# Patient Record
Sex: Male | Born: 1985 | Race: Black or African American | Hispanic: No | Marital: Single | Smoking: Current every day smoker
Health system: Southern US, Community
[De-identification: ages and names within clinical notes are randomized; demographics above are authoritative.]

## PROBLEM LIST (undated history)

## (undated) DIAGNOSIS — I82409 Acute embolism and thrombosis of unspecified deep veins of unspecified lower extremity: Secondary | ICD-10-CM

## (undated) DIAGNOSIS — F250 Schizoaffective disorder, bipolar type: Secondary | ICD-10-CM

## (undated) DIAGNOSIS — K5090 Crohn's disease, unspecified, without complications: Secondary | ICD-10-CM

---

## 2016-04-08 ENCOUNTER — Encounter (HOSPITAL_BASED_OUTPATIENT_CLINIC_OR_DEPARTMENT_OTHER): Payer: Self-pay

## 2016-04-08 ENCOUNTER — Inpatient Hospital Stay (HOSPITAL_BASED_OUTPATIENT_CLINIC_OR_DEPARTMENT_OTHER)
Admission: EM | Admit: 2016-04-08 | Discharge: 2016-04-14 | DRG: 271 | Disposition: A | Discharge status: Home / Self Care | Payer: Medicaid Other | Attending: Internal Medicine | Admitting: Internal Medicine

## 2016-04-08 ENCOUNTER — Emergency Department (HOSPITAL_BASED_OUTPATIENT_CLINIC_OR_DEPARTMENT_OTHER): Payer: Medicaid Other

## 2016-04-08 DIAGNOSIS — K5090 Crohn's disease, unspecified, without complications: Secondary | ICD-10-CM

## 2016-04-08 DIAGNOSIS — F259 Schizoaffective disorder, unspecified: Secondary | ICD-10-CM

## 2016-04-08 DIAGNOSIS — Z79891 Long term (current) use of opiate analgesic: Secondary | ICD-10-CM

## 2016-04-08 DIAGNOSIS — R5082 Postprocedural fever: Secondary | ICD-10-CM | POA: Diagnosis not present

## 2016-04-08 DIAGNOSIS — Z6824 Body mass index (BMI) 24.0-24.9, adult: Secondary | ICD-10-CM

## 2016-04-08 DIAGNOSIS — I82422 Acute embolism and thrombosis of left iliac vein: Secondary | ICD-10-CM | POA: Diagnosis present

## 2016-04-08 DIAGNOSIS — I82402 Acute embolism and thrombosis of unspecified deep veins of left lower extremity: Secondary | ICD-10-CM

## 2016-04-08 DIAGNOSIS — I82412 Acute embolism and thrombosis of left femoral vein: Principal | ICD-10-CM | POA: Diagnosis present

## 2016-04-08 DIAGNOSIS — I82442 Acute embolism and thrombosis of left tibial vein: Secondary | ICD-10-CM | POA: Diagnosis present

## 2016-04-08 DIAGNOSIS — I82401 Acute embolism and thrombosis of unspecified deep veins of right lower extremity: Secondary | ICD-10-CM

## 2016-04-08 DIAGNOSIS — D509 Iron deficiency anemia, unspecified: Secondary | ICD-10-CM

## 2016-04-08 DIAGNOSIS — Z7952 Long term (current) use of systemic steroids: Secondary | ICD-10-CM

## 2016-04-08 DIAGNOSIS — E876 Hypokalemia: Secondary | ICD-10-CM | POA: Diagnosis not present

## 2016-04-08 DIAGNOSIS — F17210 Nicotine dependence, cigarettes, uncomplicated: Secondary | ICD-10-CM | POA: Diagnosis present

## 2016-04-08 DIAGNOSIS — I82492 Acute embolism and thrombosis of other specified deep vein of left lower extremity: Secondary | ICD-10-CM | POA: Diagnosis present

## 2016-04-08 DIAGNOSIS — F250 Schizoaffective disorder, bipolar type: Secondary | ICD-10-CM | POA: Diagnosis present

## 2016-04-08 DIAGNOSIS — I82432 Acute embolism and thrombosis of left popliteal vein: Secondary | ICD-10-CM | POA: Diagnosis present

## 2016-04-08 DIAGNOSIS — I82409 Acute embolism and thrombosis of unspecified deep veins of unspecified lower extremity: Secondary | ICD-10-CM | POA: Diagnosis present

## 2016-04-08 DIAGNOSIS — I82890 Acute embolism and thrombosis of other specified veins: Secondary | ICD-10-CM

## 2016-04-08 HISTORY — DX: Acute embolism and thrombosis of unspecified deep veins of unspecified lower extremity: I82.409

## 2016-04-08 HISTORY — DX: Crohn's disease, unspecified, without complications: K50.90

## 2016-04-08 HISTORY — DX: Schizoaffective disorder, bipolar type: F25.0

## 2016-04-08 LAB — CBC WITH DIFFERENTIAL/PLATELET
Basophils Absolute: 0 10*3/uL (ref 0.0–0.1)
Basophils Relative: 0 %
EOS ABS: 0 10*3/uL (ref 0.0–0.7)
Eosinophils Relative: 0 %
HCT: 36.1 % — ABNORMAL LOW (ref 39.0–52.0)
HEMOGLOBIN: 12.6 g/dL — AB (ref 13.0–17.0)
LYMPHS ABS: 0.5 10*3/uL — AB (ref 0.7–4.0)
Lymphocytes Relative: 6 %
MCH: 26.9 pg (ref 26.0–34.0)
MCHC: 34.9 g/dL (ref 30.0–36.0)
MCV: 77.1 fL — ABNORMAL LOW (ref 78.0–100.0)
Monocytes Absolute: 0.7 10*3/uL (ref 0.1–1.0)
Monocytes Relative: 7 %
NEUTROS PCT: 87 %
Neutro Abs: 8.3 10*3/uL — ABNORMAL HIGH (ref 1.7–7.7)
Platelets: 356 10*3/uL (ref 150–400)
RBC: 4.68 MIL/uL (ref 4.22–5.81)
RDW: 13.7 % (ref 11.5–15.5)
WBC: 9.6 10*3/uL (ref 4.0–10.5)

## 2016-04-08 LAB — BASIC METABOLIC PANEL
ANION GAP: 12 (ref 5–15)
BUN: 9 mg/dL (ref 6–20)
CHLORIDE: 89 mmol/L — AB (ref 101–111)
CO2: 31 mmol/L (ref 22–32)
CREATININE: 0.8 mg/dL (ref 0.61–1.24)
Calcium: 9.3 mg/dL (ref 8.9–10.3)
GFR calc non Af Amer: >60 mL/min (ref >60)
Glucose, Bld: 109 mg/dL — ABNORMAL HIGH (ref 65–99)
POTASSIUM: 4.1 mmol/L (ref 3.5–5.1)
SODIUM: 132 mmol/L — AB (ref 135–145)

## 2016-04-08 MED ORDER — HEPARIN (PORCINE) IN NACL 100-0.45 UNIT/ML-% IJ SOLN
2600.0000 [IU]/h | INTRAMUSCULAR | Status: DC
  Administered 2016-04-08: 1450 [IU]/h via INTRAVENOUS
  Administered 2016-04-10: 2200 [IU]/h via INTRAVENOUS
  Administered 2016-04-10: 2350 [IU]/h via INTRAVENOUS
  Administered 2016-04-10: 1950 [IU]/h via INTRAVENOUS
  Administered 2016-04-11 (×2): 2350 [IU]/h via INTRAVENOUS
  Administered 2016-04-12: 2600 [IU]/h via INTRAVENOUS
  Administered 2016-04-12: 2450 [IU]/h via INTRAVENOUS
  Administered 2016-04-13: 2600 [IU]/h via INTRAVENOUS
  Filled 2016-04-08 (×21): qty 250

## 2016-04-08 MED ORDER — SODIUM CHLORIDE 0.9 % IV BOLUS (SEPSIS)
2000.0000 mL | Freq: Once | INTRAVENOUS | Status: AC
  Administered 2016-04-08: 2000 mL via INTRAVENOUS

## 2016-04-08 MED ORDER — HEPARIN BOLUS VIA INFUSION
5000.0000 [IU] | Freq: Once | INTRAVENOUS | Status: AC
  Administered 2016-04-08: 5000 [IU] via INTRAVENOUS

## 2016-04-08 MED ORDER — HYDROMORPHONE HCL 1 MG/ML IJ SOLN
1.0000 mg | Freq: Once | INTRAMUSCULAR | Status: AC
  Administered 2016-04-08: 1 mg via INTRAVENOUS
  Filled 2016-04-08: qty 1

## 2016-04-08 MED ORDER — IOPAMIDOL (ISOVUE-370) INJECTION 76%
100.0000 mL | Freq: Once | INTRAVENOUS | Status: AC | PRN
  Administered 2016-04-08: 100 mL via INTRAVENOUS

## 2016-04-08 NOTE — Plan of Care (Addendum)
30 yo M with massive DVT of leg.  Dosent have phlegmasia thankfully, but IR says re-consult them in the morning, because this gets up in to pelvis, they almost always thrombo-lyse in these cases.  Heparin gtt for now.  Going to med-surg obs.

## 2016-04-08 NOTE — ED Notes (Signed)
C/o back pain x 2 weeks and left leg pain x 1 week,  Was seen by his pcp today and dx w dvt,  Was sent to ed by pcp

## 2016-04-08 NOTE — ED Notes (Signed)
[facility] is calling IR for Dr. Criss Alvine

## 2016-04-08 NOTE — ED Provider Notes (Signed)
CSN: [id number]     Arrival date & time 04/08/16  1830 History  By signing my name below, I, [facility], attest that this documentation has been prepared under the direction and in the presence of Pricilla Loveless, MD. Electronically Signed: Randell Patient, ED Scribe. 04/08/2016. 10:50 PM.   Chief Complaint  Patient presents with  . DVT   The history is provided by the patient and a parent. No language interpreter was used.   HPI Comments: Mark Orozco is a 30 y.o. male with an hx of Crohn's disease and DVT who presents to the Emergency Department complaining of constant, moderate, inner left leg pain onset 1 week ago. He notes that his pain is worse in his inner upper left leg but progressed to his lower left leg today. Pt states that pain began in his lower back, worse on the left, 2 weeks ago which progressed to left leg pain 1 week ago. He notes that he was seen earlier today for this complaint where he was diagnosed with a DVT in his left leg extending from his left knee to his groin by [place]. He reports intermittent SOB secondary to increased pain and abdominal pain that only presents when he moves. He takes generic Plavix. Denies taking Coumadin currently. 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 Denies CP, leg swelling, SOB currently, and any other symptoms.  Past Medical History  Diagnosis Date  . DVT (deep venous thrombosis) (HCC)   . Schizoaffective disorder, bipolar type (HCC)   . Crohn disease (HCC)    History reviewed. No pertinent past surgical history. No family history on file. Social History  Substance Use Topics  . Smoking status: Current Every Day Smoker  . Smokeless tobacco: None  . Alcohol Use: Yes     Comment: social    Review of Systems  Respiratory: Negative for shortness of breath.    Cardiovascular: Negative for chest pain and leg swelling.  Musculoskeletal: Positive for myalgias.  All other systems reviewed and are negative.  Allergies  Tylenol  Home Medications   Prior to Admission medications   Medication Sig Start Date End Date Taking? Authorizing Provider  BACLOFEN PO Take by mouth.   Yes Historical Provider, MD  DIAZEPAM PO Take by mouth.   Yes Historical Provider, MD  FAMOTIDINE PO Take by mouth.   Yes Historical Provider, MD  fluPHENAZine (PROLIXIN) 5 MG tablet Take 5 mg by mouth 2 (two) times daily.   Yes Historical Provider, MD  mesalamine (LIALDA) 1.2 g EC tablet Take by mouth daily with breakfast.   Yes Historical Provider, MD  Oxcarbazepine (TRILEPTAL) 300 MG tablet Take 300 mg by mouth 2 (two) times daily.   Yes Historical Provider, MD  OXYCODONE HCL PO Take by mouth.   Yes Historical Provider, MD  Paliperidone Palmitate (INVEGA SUSTENNA IM) Inject into the muscle.   Yes Historical Provider, MD  PREDNISONE PO Take by mouth.   Yes Historical Provider, MD   BP 131/85 mmHg  Pulse 85  Temp(Src) 97.8 F (36.6 C) (Oral)  Resp 12  Ht  (1.854 m)  Wt 187 lb (84.823 kg)  BMI 24.68 kg/m2  SpO2 100% Physical Exam  Constitutional: He is oriented to person, place, and time. He appears well-developed and well-nourished.  HENT:  Head: Normocephalic and  atraumatic.  Right Ear: External ear normal.  Left Ear: External ear normal.  Nose: Nose normal.  Eyes: Right eye exhibits no discharge. Left eye exhibits no discharge.  Neck: Neck supple.  Cardiovascular: Normal rate, regular rhythm, normal heart sounds and intact distal pulses.   2+ DP pulses bilaterally.  Pulmonary/Chest: Effort normal and breath sounds normal.  Abdominal: Soft. There is no tenderness.  Musculoskeletal: He exhibits tenderness. He exhibits no edema.       Legs: Left leg with calf tenderness and medial thigh tenderness. No swelling. No ecchymosis or other color change besides mild  erythema. Normal strength and sensation.  Neurological: He is alert and oriented to person, place, and time.  Skin: Skin is warm and dry.  Nursing note and vitals reviewed.   ED Course  Procedures   DIAGNOSTIC STUDIES: Oxygen Saturation is 100% on RA, normal by my interpretation.    COORDINATION OF CARE: 7:43 PM Will order IV fluids, Dilaudid, abdomen venogram CT scan, and labs. Discussed treatment plan with pt at bedside and pt agreed to plan.  8:49 PM Returned to speak with pt about treatment plan.  10:36 PM Reviewed lab and advanced imaging results. Will consult with vascular surgery and admit pt.  10:43 PM Consulted with on-call vascular surgeon who advised to consult with IR. Will consult with Interventional Radiology.  10:47 PM Consulted with on-call interventional radiologist. Will transfer to [facility].  Labs Review Labs Reviewed  BASIC METABOLIC PANEL - Abnormal; Notable for the following:    Sodium 132 (*)    Chloride 89 (*)    Glucose, Bld 109 (*)    All other components within normal limits  CBC WITH DIFFERENTIAL/PLATELET - Abnormal; Notable for the following:    Hemoglobin 12.6 (*)    HCT 36.1 (*)    MCV 77.1 (*)    Neutro Abs 8.3 (*)    Lymphs Abs 0.5 (*)    All other components within normal limits    Imaging Review Ct Venogram Abd/pel  04/08/2016  CLINICAL DATA:  Patient was diagnosed with deep venous thrombosis today in the left leg. Back pain. EXAM: CT venogram of  the abdomen, pelvis, and lower extremities TECHNIQUE: Axial CT images of the abdomen, pelvis, and bilateral lower extremities from hip to below the knee are obtained with contrast material during venous phase. Additional sagittal and coronal multiplanar reformatted images and maximal intensity projection images are obtained. CONTRAST:  100 mL Isovue 370 COMPARISON:  None. FINDINGS: CT venogram: There is dilatation and filling defect consistent with deep venous thrombosis involving the left  tibioperoneal trunk, left popliteal vein, left deep femoral vein, left common femoral vein, left external iliac vein, and left internal iliac vein. Left deep femoral vein is also involved. The inferior vena cava and iliac veins are somewhat flattened appear patent. Right pelvic and right leg veins are patent. Left leg veins demonstrate infiltration in the fat around the venous structures possibly due to obstruction or possibly indicating infection. Thrombophlebitis is not excluded. Mild diffuse soft tissue edema in the left leg. CT abdomen and pelvis: Lung bases are clear. The liver, spleen, gallbladder, pancreas, adrenal glands, kidneys, abdominal aorta, and retroperitoneal lymph nodes are unremarkable. Stomach, small bowel, and colon are not abnormally distended. No free air or free fluid in the abdomen. Pelvis: Bladder wall is not thickened. No pelvic mass or lymphadenopathy. No free or loculated pelvic fluid collections. Prostate gland is not enlarged. Edema in the soft tissues with superficial S collaterals.  Scattered lymph nodes in the pelvis are not abnormally distended. Appendix is normal. No destructive bone lesions. IMPRESSION: Deep venous thrombosis demonstrated involving the left pelvic veins and deep venous system of the left leg, extending from the left external iliac, internal iliac, common femoral, superficial and deep femoral, popliteal, and tibial peroneal veins. Associated edema and collateral venous structures demonstrated. Infiltration around the deep veins of the left leg may indicate edema or phlebitis. Electronically Signed   By: Burman Nieves M.D.   On: 04/08/2016 22:15   I have personally reviewed and evaluated these images and lab results as part of my medical decision-making.   EKG Interpretation None      MDM   Final diagnoses:  Acute deep vein thrombosis of pelvic vein  Leg DVT (deep venous thromboembolism), acute, left (HCC)    Patient's CT shows that the DVT goes  proximally all the way to the pelvic veins. Patient's pain is currently controlled and does not have severe, uncontrollable pain. Has very slight erythema over proximal thigh but no signs of phlegmasia cerulea dolens. Discussed with vascular surgery who recommends IR. Discussed with Dr. Karl Ito of IR who recommends admission to the hospitalist, IV heparin which was already started, and IR consult in the morning. Discussed with Dr. Julian Reil, who accepts the patient and admission and transfer. Request medical observation bed.  I personally performed the services described in this documentation, which was scribed in my presence. The recorded information has been reviewed and is accurate.    Pricilla Loveless, MD 04/08/16 2351

## 2016-04-08 NOTE — Progress Notes (Signed)
ANTICOAGULATION CONSULT NOTE - Initial Consult  Pharmacy Consult for Heparin Indication: DVT  Allergies  Allergen Reactions  . Tylenol [Acetaminophen]     Patient Measurements: Height:  (185.4 cm) Weight: 187 lb (84.823 kg) IBW/kg (Calculated) : 79.9 Heparin Dosing Weight: 84 kg  Vital Signs: Temp: 97.8 F (36.6 C) (05/15 1846) Temp Source: Oral (05/15 1846) BP: 136/91 mmHg (05/15 1846) Pulse Rate: 95 (05/15 1846)  Labs: No results for input(s): HGB, HCT, PLT, APTT, LABPROT, INR, HEPARINUNFRC, HEPRLOWMOCWT, CREATININE, CKTOTAL, CKMB, TROPONINI in the last 72 hours.  CrCl cannot be calculated (Patient has no serum creatinine result on file.).   Medical History: Past Medical History  Diagnosis Date  . DVT (deep venous thrombosis) (HCC)   . Schizoaffective disorder, bipolar type (HCC)   . Crohn disease (HCC)     Medications:   (Not in a hospital admission) Scheduled:  .  HYDROmorphone (DILAUDID) injection  1 mg Intravenous Once   Infusions:  . sodium chloride      Assessment: 29yo male presents to [facility] from PCP with diagnosis of DVT. Pharmacy is consulted to dose heparin for DVT.   Goal of Therapy:  Heparin level 0.3-0.7 units/ml Monitor platelets by anticoagulation protocol: Yes   Plan:  Give 5000 units bolus x 1 Start heparin infusion at 1450 units/hr Check anti-Xa level in 6 hours and daily while on heparin Continue to monitor H&H and platelets  Arlean Hopping. Newman Pies, PharmD, BCPS Clinical Pharmacist Pager [phone] 04/08/2016,8:04 PM

## 2016-04-08 NOTE — ED Notes (Signed)
C/o left LE pain x 1 week-lower back pain x 2 weeks-sent from PCP for DVT to left LE-presents to triage in w/c

## 2016-04-09 ENCOUNTER — Encounter (HOSPITAL_COMMUNITY): Payer: Self-pay | Admitting: Nurse Practitioner

## 2016-04-09 DIAGNOSIS — I82432 Acute embolism and thrombosis of left popliteal vein: Secondary | ICD-10-CM | POA: Diagnosis present

## 2016-04-09 DIAGNOSIS — I82402 Acute embolism and thrombosis of unspecified deep veins of left lower extremity: Secondary | ICD-10-CM | POA: Diagnosis not present

## 2016-04-09 DIAGNOSIS — R5082 Postprocedural fever: Secondary | ICD-10-CM | POA: Diagnosis not present

## 2016-04-09 DIAGNOSIS — E876 Hypokalemia: Secondary | ICD-10-CM | POA: Diagnosis not present

## 2016-04-09 DIAGNOSIS — K5090 Crohn's disease, unspecified, without complications: Secondary | ICD-10-CM | POA: Diagnosis present

## 2016-04-09 DIAGNOSIS — I82412 Acute embolism and thrombosis of left femoral vein: Secondary | ICD-10-CM | POA: Diagnosis present

## 2016-04-09 DIAGNOSIS — F17210 Nicotine dependence, cigarettes, uncomplicated: Secondary | ICD-10-CM | POA: Diagnosis present

## 2016-04-09 DIAGNOSIS — D509 Iron deficiency anemia, unspecified: Secondary | ICD-10-CM | POA: Diagnosis present

## 2016-04-09 DIAGNOSIS — K5000 Crohn's disease of small intestine without complications: Secondary | ICD-10-CM | POA: Diagnosis not present

## 2016-04-09 DIAGNOSIS — F250 Schizoaffective disorder, bipolar type: Secondary | ICD-10-CM | POA: Diagnosis present

## 2016-04-09 DIAGNOSIS — I82401 Acute embolism and thrombosis of unspecified deep veins of right lower extremity: Secondary | ICD-10-CM | POA: Diagnosis not present

## 2016-04-09 DIAGNOSIS — Z6824 Body mass index (BMI) 24.0-24.9, adult: Secondary | ICD-10-CM | POA: Diagnosis not present

## 2016-04-09 DIAGNOSIS — I82442 Acute embolism and thrombosis of left tibial vein: Secondary | ICD-10-CM | POA: Diagnosis present

## 2016-04-09 DIAGNOSIS — M79605 Pain in left leg: Secondary | ICD-10-CM | POA: Diagnosis present

## 2016-04-09 DIAGNOSIS — F259 Schizoaffective disorder, unspecified: Secondary | ICD-10-CM

## 2016-04-09 DIAGNOSIS — I82492 Acute embolism and thrombosis of other specified deep vein of left lower extremity: Secondary | ICD-10-CM | POA: Diagnosis present

## 2016-04-09 DIAGNOSIS — Z7952 Long term (current) use of systemic steroids: Secondary | ICD-10-CM | POA: Diagnosis not present

## 2016-04-09 DIAGNOSIS — I82422 Acute embolism and thrombosis of left iliac vein: Secondary | ICD-10-CM | POA: Diagnosis present

## 2016-04-09 DIAGNOSIS — Z79891 Long term (current) use of opiate analgesic: Secondary | ICD-10-CM | POA: Diagnosis not present

## 2016-04-09 LAB — HEPARIN LEVEL (UNFRACTIONATED)
HEPARIN UNFRACTIONATED: 0.19 [IU]/mL — AB (ref 0.30–0.70)
Heparin Unfractionated: 0.14 IU/mL — ABNORMAL LOW (ref 0.30–0.70)

## 2016-04-09 LAB — RETICULOCYTES
RBC.: 4.19 MIL/uL — ABNORMAL LOW (ref 4.22–5.81)
RETIC COUNT ABSOLUTE: 37.7 10*3/uL (ref 19.0–186.0)
Retic Ct Pct: 0.9 % (ref 0.4–3.1)

## 2016-04-09 LAB — CBC
HEMATOCRIT: 35.4 % — AB (ref 39.0–52.0)
Hemoglobin: 11.7 g/dL — ABNORMAL LOW (ref 13.0–17.0)
MCH: 25.9 pg — AB (ref 26.0–34.0)
MCHC: 33.1 g/dL (ref 30.0–36.0)
MCV: 78.3 fL (ref 78.0–100.0)
Platelets: 361 10*3/uL (ref 150–400)
RBC: 4.52 MIL/uL (ref 4.22–5.81)
RDW: 13.7 % (ref 11.5–15.5)
WBC: 8.5 10*3/uL (ref 4.0–10.5)

## 2016-04-09 LAB — PROTIME-INR
INR: 1.17 (ref 0.00–1.49)
PROTHROMBIN TIME: 15.1 s (ref 11.6–15.2)

## 2016-04-09 LAB — VITAMIN B12: VITAMIN B 12: 857 pg/mL (ref 180–914)

## 2016-04-09 LAB — FERRITIN: FERRITIN: 1002 ng/mL — AB (ref 24–336)

## 2016-04-09 LAB — IRON AND TIBC
IRON: 21 ug/dL — AB (ref 45–182)
Saturation Ratios: 11 % — ABNORMAL LOW (ref 17.9–39.5)
TIBC: 190 ug/dL — AB (ref 250–450)
UIBC: 169 ug/dL

## 2016-04-09 LAB — APTT: APTT: 34 s (ref 24–37)

## 2016-04-09 MED ORDER — OXYCODONE HCL ER 10 MG PO T12A
10.0000 mg | EXTENDED_RELEASE_TABLET | Freq: Two times a day (BID) | ORAL | Status: DC | PRN
  Administered 2016-04-09 – 2016-04-14 (×9): 10 mg via ORAL
  Filled 2016-04-09 (×11): qty 1

## 2016-04-09 MED ORDER — POLYETHYLENE GLYCOL 3350 17 G PO PACK
17.0000 g | PACK | Freq: Every day | ORAL | Status: DC | PRN
  Filled 2016-04-09: qty 1

## 2016-04-09 MED ORDER — HEPARIN BOLUS VIA INFUSION
2500.0000 [IU] | Freq: Once | INTRAVENOUS | Status: AC
  Administered 2016-04-09: 2500 [IU] via INTRAVENOUS
  Filled 2016-04-09: qty 2500

## 2016-04-09 MED ORDER — BENZTROPINE MESYLATE 0.5 MG PO TABS
0.5000 mg | ORAL_TABLET | Freq: Two times a day (BID) | ORAL | Status: DC
  Administered 2016-04-09 – 2016-04-14 (×10): 0.5 mg via ORAL
  Filled 2016-04-09 (×11): qty 1

## 2016-04-09 MED ORDER — BISACODYL 5 MG PO TBEC: 5.0000 mg | DELAYED_RELEASE_TABLET | Freq: Every day | ORAL | Status: DC | PRN

## 2016-04-09 MED ORDER — ONDANSETRON HCL 4 MG/2ML IJ SOLN: 4.0000 mg | Freq: Four times a day (QID) | INTRAMUSCULAR | Status: DC | PRN

## 2016-04-09 MED ORDER — ONDANSETRON HCL 4 MG PO TABS: 4.0000 mg | ORAL_TABLET | Freq: Four times a day (QID) | ORAL | Status: DC | PRN

## 2016-04-09 MED ORDER — MESALAMINE 1.2 G PO TBEC
4.8000 g | DELAYED_RELEASE_TABLET | Freq: Every day | ORAL | Status: DC
  Administered 2016-04-11 – 2016-04-14 (×4): 4.8 g via ORAL
  Filled 2016-04-09 (×6): qty 4

## 2016-04-09 MED ORDER — ZOLPIDEM TARTRATE 5 MG PO TABS
5.0000 mg | ORAL_TABLET | Freq: Once | ORAL | Status: AC
  Administered 2016-04-09: 5 mg via ORAL
  Filled 2016-04-09: qty 1

## 2016-04-09 MED ORDER — SODIUM CHLORIDE 0.9 % IV SOLN
INTRAVENOUS | Status: DC
  Administered 2016-04-09 – 2016-04-13 (×8): via INTRAVENOUS

## 2016-04-09 MED ORDER — HYDROMORPHONE HCL 1 MG/ML IJ SOLN
1.0000 mg | INTRAMUSCULAR | Status: DC | PRN
  Administered 2016-04-09 – 2016-04-10 (×7): 1 mg via INTRAVENOUS
  Filled 2016-04-09 (×7): qty 1

## 2016-04-09 MED ORDER — OXCARBAZEPINE 300 MG PO TABS
300.0000 mg | ORAL_TABLET | Freq: Two times a day (BID) | ORAL | Status: DC
  Administered 2016-04-09 – 2016-04-14 (×10): 300 mg via ORAL
  Filled 2016-04-09 (×11): qty 1

## 2016-04-09 MED ORDER — HYDROMORPHONE HCL 2 MG PO TABS
1.0000 mg | ORAL_TABLET | Freq: Once | ORAL | Status: AC
  Administered 2016-04-09: 1 mg via ORAL
  Filled 2016-04-09: qty 1

## 2016-04-09 MED ORDER — FLUPHENAZINE HCL 5 MG PO TABS
5.0000 mg | ORAL_TABLET | Freq: Two times a day (BID) | ORAL | Status: DC
  Administered 2016-04-09 – 2016-04-14 (×10): 5 mg via ORAL
  Filled 2016-04-09 (×11): qty 1

## 2016-04-09 MED ORDER — HYDROMORPHONE HCL 1 MG/ML IJ SOLN
1.0000 mg | Freq: Once | INTRAMUSCULAR | Status: AC
  Administered 2016-04-09: 1 mg via INTRAVENOUS
  Filled 2016-04-09: qty 1

## 2016-04-09 MED ORDER — MIRTAZAPINE 30 MG PO TABS
30.0000 mg | ORAL_TABLET | Freq: Every day | ORAL | Status: DC
  Administered 2016-04-09 – 2016-04-13 (×5): 30 mg via ORAL
  Filled 2016-04-09 (×3): qty 1
  Filled 2016-04-09: qty 2
  Filled 2016-04-09: qty 1

## 2016-04-09 MED ORDER — VILAZODONE HCL 20 MG PO TABS
20.0000 mg | ORAL_TABLET | Freq: Every day | ORAL | Status: DC
  Administered 2016-04-11 – 2016-04-14 (×4): 20 mg via ORAL
  Filled 2016-04-09 (×6): qty 1

## 2016-04-09 NOTE — Consult Note (Signed)
Chief Complaint: Patient was seen in consultation today for  left lower extremity venography with possible  thrombolytic therapy/angioplasty/stenting Chief Complaint  Patient presents with  . DVT    Referring Physician(s): Gardner,Jared  Supervising Physician: Ruel Favors  Patient Status: In-pt   History of Present Illness: Mark Orozco is a 30 y.o. male with past medical history significant for premature birth, Crohn's disease since 2011, positive tobacco use, schizoaffective/bipolar disorder with admitted to the hospital with left lower extremity pain/back pain for 1-2 weeks duration in addition to occasional dyspnea with exertion and intermittent abdominal discomfort. He was referred to the hospital after being evaluated the PCP office with lower extremity venous Doppler revealing acute DVT involving the peroneal vein, posterior tibial veins, popliteal vein, femoral vein, common femoral vein, external iliac vein and common iliac vein. There was also an acute SFTP in the left SSV. 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 According to the patient's mother at birth multiple "cut downs" were required for venous access in the lower extremities to assist with feeds /medication use. Since age 31 the patient has had multiple right lower extremity DVTs and is currently maintained on plavix.  He is followed by vascular surgery at [facility]. His mother states that he has never had a left lower extremity DVT. He denies any major surgical procedures , recent GI bleeding/recent trauma, malignancy or aneurysms. CT venogram of the abdomen/ pelvis and lower extremities on 5/15 revealed DVT involving the left pelvic veins ,deep venous system of the left leg, extending from the left external iliac, internal iliac, common femoral, superficial and deep femoral, popliteal and tibial peroneal veins. He also has edema in collateral venous  structures . Also the inferior vena cava and iliac veins were somewhat flattened/diminutive but patent. Request now received from primary service for consideration of left lower extremity venography with possible thrombolytic therapy.  Past Medical History  Diagnosis Date  . DVT (deep venous thrombosis) (HCC)   . Schizoaffective disorder, bipolar type (HCC)   . Crohn disease (HCC)   . 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 No pertinent past surgical history.  Allergies: Ibuprofen and Tylenol  Medications: Prior to Admission medications   Medication Sig Start Date End Date Taking? Authorizing Provider  BACLOFEN PO Take 20 mg by mouth every 8 (eight) hours as needed (muscle spasms).    Yes Historical Provider, MD  benztropine (COGENTIN) 0.5 MG tablet Take 0.5 mg by mouth 2 (two) times daily.   Yes Historical Provider, MD  clopidogrel (PLAVIX) 75 MG tablet Take 75 mg by mouth daily.   Yes Historical Provider, MD  DIAZEPAM PO Take 5 mg by mouth 4 (four) times daily.    Yes Historical Provider, MD  FAMOTIDINE PO Take 20 mg by mouth 2 (two) times daily.    Yes Historical Provider, MD  fluPHENAZine (PROLIXIN) 5 MG tablet Take 5 mg by mouth 2 (two) times daily.   Yes Historical Provider, MD  mesalamine (LIALDA) 1.2 g EC tablet Take 4.8 g by mouth daily with breakfast.    Yes Historical Provider, MD  mirtazapine (REMERON) 30 MG tablet Take 30 mg by mouth at bedtime.   Yes Historical Provider, MD  Oxcarbazepine (TRILEPTAL) 300 MG tablet Take 300 mg by mouth 2 (two) times daily.   Yes Historical Provider, MD  OXYCODONE HCL PO Take by mouth.  Yes Historical Provider, MD  Paliperidone Palmitate (INVEGA SUSTENNA IM) Inject 156 mg into the muscle every 30 (thirty) days.    Yes Historical Provider, MD  PREDNISONE PO Take 10 mg by mouth See admin instructions. On taper dose pack.   Yes Historical Provider, MD  Vilazodone HCl (VIIBRYD) 20 MG TABS Take 20 mg by mouth daily.    Yes Historical Provider, MD     Family History  Problem Relation Age of Onset  . Diabetes Mother   . Diabetes Maternal Grandmother     Social History   Social History  . Marital Status: Single    Spouse Name: N/A  . Number of Children: N/A  . Years of Education: N/A   Social History Main Topics  . Smoking status: Current Every Day Smoker  . Smokeless tobacco: None  . Alcohol Use: Yes     Comment: social  . Drug Use: No  . Sexual Activity: Not Asked   Other Topics Concern  . None   Social History Narrative      Review of Systems he currently denies fever, HA, CP, dyspnea, cough, abd pain, N/V ; he has fullness in left upper thigh region  Vital Signs: BP 129/73 mmHg  Pulse 96  Temp(Src) 98.1 F (36.7 C) (Oral)  Resp 18  Ht  (1.854 m)  Wt 187 lb (84.823 kg)  BMI 24.68 kg/m2  SpO2 100%  Physical Exam  Pt awake/,sl drowsy, but responds appropriately to questions; chest- CTA bilat; heart- RRR; abd- soft,+BS,NT; lower ext with no sig edema, intact distal pulses; tenderness left inner thigh region; sens/motor fxn ok; surgical scars bilat thigh regions from prev access at birth Mallampati Score:     Imaging: Ct Venogram Abd/pel  04/08/2016  CLINICAL DATA:  Patient was diagnosed with deep venous thrombosis today in the left leg. Back pain. EXAM: CT venogram of  the abdomen, pelvis, and lower extremities TECHNIQUE: Axial CT images of the abdomen, pelvis, and bilateral lower extremities from hip to below the knee are obtained with contrast material during venous phase. Additional sagittal and coronal multiplanar reformatted images and maximal intensity projection images are obtained. CONTRAST:  100 mL Isovue 370 COMPARISON:  None. FINDINGS: CT venogram: There is dilatation and filling defect consistent with deep venous thrombosis involving the left tibioperoneal trunk, left popliteal vein, left deep femoral vein, left common femoral vein, left external iliac vein, and  left internal iliac vein. Left deep femoral vein is also involved. The inferior vena cava and iliac veins are somewhat flattened appear patent. Right pelvic and right leg veins are patent. Left leg veins demonstrate infiltration in the fat around the venous structures possibly due to obstruction or possibly indicating infection. Thrombophlebitis is not excluded. Mild diffuse soft tissue edema in the left leg. CT abdomen and pelvis: Lung bases are clear. The liver, spleen, gallbladder, pancreas, adrenal glands, kidneys, abdominal aorta, and retroperitoneal lymph nodes are unremarkable. Stomach, small bowel, and colon are not abnormally distended. No free air or free fluid in the abdomen. Pelvis: Bladder wall is not thickened. No pelvic mass or lymphadenopathy. No free or loculated pelvic fluid collections. Prostate gland is not enlarged. Edema in the soft tissues with superficial S collaterals. Scattered lymph nodes in the pelvis are not abnormally distended. Appendix is normal. No destructive bone lesions. IMPRESSION: Deep venous thrombosis demonstrated involving the left pelvic veins and deep venous system of the left leg, extending from the left external iliac, internal iliac, common femoral, superficial and  deep femoral, popliteal, and tibial peroneal veins. Associated edema and collateral venous structures demonstrated. Infiltration around the deep veins of the left leg may indicate edema or phlebitis. Electronically Signed   By: Burman Nieves M.D.   On: 04/08/2016 22:15    Labs:  CBC:  Recent Labs  04/08/16 2035 04/09/16 0733  WBC 9.6 8.5  HGB 12.6* 11.7*  HCT 36.1* 35.4*  PLT 356 361    COAGS: No results for input(s): INR, APTT in the last 8760 hours.  BMP:  Recent Labs  04/08/16 2035  NA 132*  K 4.1  CL 89*  CO2 31  GLUCOSE 109*  BUN 9  CALCIUM 9.3  CREATININE 0.80  GFRNONAA >60  GFRAA >60    LIVER FUNCTION TESTS: No results for input(s): BILITOT, AST, ALT, ALKPHOS,  PROT, ALBUMIN in the last 8760 hours.  TUMOR MARKERS: No results for input(s): AFPTM, CEA, CA199, CHROMGRNA in the last 8760 hours.  Assessment and Plan:  Patient with past medical history significant for Crohn's disease, bipolar/schizoaffective disorder, tobacco use, prior right lower extremity DVTs, premature birth (about 22 weeks) with history of several "cut downs" for vascular access in bilateral lower extremities, admitted  now with 1 -2 week history of low back and left lower extremity pain and finding of acute extensive DVT of the left lower extremity. Also with somewhat flattened ? occl distal IVC/iliac bifurcation by CT. Patient currently on IV heparin therapy. Request received for consideration of LLE DVT thrombolysis; imaging studies have been reviewed by Dr. Miles Costain. Current laboratory values reveal WBC 8.5, hemoglobin 11.7, platelets 361k, creatinine 0.8. Details/risks of venous thrombolysis/thrombectomy/angioplasty/poss stenting, including but not limited to, internal bleeding, infection, contrast nephropathy, injury to vascular structures, inability to fully lyse clot d/w pt/mother with their understanding and consent. If thrombolysis initiated pt will be monitored in ICU during infusion period. Procedure tent scheduled for 5/17 am.    Thank you for this interesting consult.  I greatly enjoyed meeting Mark Orozco and look forward to participating in their care.  A copy of this report was sent to the requesting provider on this date.  Electronically Signed: D. Jeananne Rama 04/09/2016, 1:38 PM    I spent a total of 40 minutes in face to face in clinical consultation, greater than 50% of which was counseling/coordinating care for LLE DVT thrombolytic therapy

## 2016-04-09 NOTE — Progress Notes (Signed)
Dr. Norma Fredrickson of [facility] returned my call. Patient known to him over the last 6-7 years. Patient has history of Crohn's colitis with occasional flares responsive to steroid tapers. Maintained on Lialda. Patient overdue for follow-up appointment. Following discharge patient needs to schedule follow up appointment with Dr. Norma Fredrickson. Office phone is [phone]

## 2016-04-09 NOTE — Progress Notes (Signed)
ANTICOAGULATION CONSULT NOTE - Initial Consult  Pharmacy Consult for Heparin Indication: DVT  Allergies  Allergen Reactions  . Ibuprofen     Messes stomach up  . Tylenol [Acetaminophen]     Messes stomach up    Patient Measurements: Height:  (185.4 cm) Weight: 187 lb (84.823 kg) IBW/kg (Calculated) : 79.9 Heparin Dosing Weight: 84 kg  Vital Signs: Temp: 98.4 F (36.9 C) (05/16 0516) Temp Source: Oral (05/16 0516) BP: 133/87 mmHg (05/16 0516) Pulse Rate: 92 (05/16 0516)  Labs:  Recent Labs  04/08/16 2035 04/09/16 0733  HGB 12.6* 11.7*  HCT 36.1* 35.4*  PLT 356 361  HEPARINUNFRC  --  0.14*  CREATININE 0.80  --     Estimated Creatinine Clearance: 154 mL/min (by C-G formula based on Cr of 0.8).  Assessment: 29yo male with massive DVT of leg, on IV heparin. Heparin level 0.14, subtherapeutic on 1450 units/hr. CBC stable. No bleeding noted per chart. Consulting IR  Goal of Therapy:  Heparin level 0.3-0.7 units/ml Monitor platelets by anticoagulation protocol: Yes   Plan:  Rebolus 2500 units Increase heparin rate to 1700 units/hr Recheck heparin level at 1600 F/u IR plans.    Bayard Hugger, PharmD, BCPS  Clinical Pharmacist  Pager: [phone]   04/09/2016,9:28 AM

## 2016-04-09 NOTE — H&P (Signed)
History and Physical    Mark Orozco OZD:[id number] DOB: 26-Jan-1986 DOA: 04/08/2016  PCP: Ananias Pilgrim, MD at [facility]  Patient coming from:  Home  Chief Complaint:  LLE DVT  HPI: Mark Orozco is a 30 y.o. male with medical history significant for premature birth (4 months early), Crohn's disease, recurrent RLE DVTs and schizoaffective disorder, bipolar type. Patient normally receives care at [facility], he has no records in the [facility] system.  Per mother, patient was born 4 months premature. She describes premature birth been complicated by a collapse in patient's vascular system requiring some deep cuts to patient's thighs. Around age 41 patient began having problems with right lower extremity DVTs, he has had several and mother states they are secondary to the cuts made into thighs as infant. Patient followed for years by vascular surgery at [facility]. It sounds like there was talk of an IVC filter at one time but patient told the clot with never ascend.   Several days ago patient began having lower back pain. He was seen at an a [facility] urgent care center, given a Toradol injection and prescribed a muscle relaxer . For persistent pain patient's mother took him to [facility], MRI of lumbar spine revealed lumbar disc disease . Patient prescribed pain medication. The pain did not improve and began radiating into back of left left thigh and inner left thigh. Pain constant. No alleviating factors.  Patient's mother took him back to [facility] yesterday where where an ultrasound of the left leg was ordered must have shown massive LLE DVT and patient directed to [facility].  Patient has little Gastroenterology records in [facility] . He was diagnosed in 2011 and has been maintained on Lialda. No history of bowel resections . Usually averages 1-2 solid bowel movements a day. Patient has been fasting for health reasons and last bowel movement was 4 days ago. No  blood in stools. Appetite adequate, weight stable  ED Course:  IV Dilaudid., 2000 mL normal saline bolus. Heparin infusion started. LLE Venogram shows massive LLE DVT  Review of Systems:  Occasional abdominal pain with certain movements such as bending over . As per HPI, otherwise 10 point review of systems negative.    Past Medical History  Diagnosis Date  . DVT (deep venous thrombosis) (HCC)   . Schizoaffective disorder, bipolar type (HCC)   . Crohn disease (HCC)     History reviewed. No pertinent past surgical history.   reports that he Quit tobacco a month ago. He was occasionally smoking marijuana but none in a month   He does not have any smokeless tobacco history on file. He reports that he drinks alcohol. He reports that he does not use illicit drugs.  Allergies  Allergen Reactions  . Tylenol [Acetaminophen]     [facility]:  Mother-diabetes Maternal grandmother- diabetes   Prior to Admission medications   Medication Sig Start Date End Date Taking? Authorizing Provider  BACLOFEN PO Take by mouth.   Yes Historical Provider, MD  DIAZEPAM PO Take by mouth.   Yes Historical Provider, MD  FAMOTIDINE PO Take by mouth.   Yes Historical Provider, MD  fluPHENAZine (PROLIXIN) 5 MG tablet Take 5 mg by mouth 2 (two) times daily.   Yes Historical Provider, MD  mesalamine (LIALDA) 1.2 g EC tablet Take by mouth daily with breakfast.   Yes Historical Provider, MD  Oxcarbazepine (TRILEPTAL) 300 MG tablet Take 300 mg by mouth 2 (two) times daily.   Yes Historical  Provider, MD  OXYCODONE HCL PO Take by mouth.   Yes Historical Provider, MD  Paliperidone Palmitate (INVEGA SUSTENNA IM) Inject into the muscle.   Yes Historical Provider, MD  PREDNISONE PO Take by mouth.   Yes Historical Provider, MD    Physical Exam: Filed Vitals:   04/09/16 0300 04/09/16 0330 04/09/16 0400 04/09/16 0516  BP: 130/88 126/86 129/77 133/87  Pulse: 90 84 92 92  Temp:    98.4 F (36.9 C)  TempSrc:    Oral  Resp:  Height:      Weight:      SpO2: 98% 99% 100% 100%    Constitutional:  Pleasant, well developed black male in NAD, calm, comfortable Filed Vitals:   04/09/16 0300 04/09/16 0330 04/09/16 0400 04/09/16 0516  BP: 130/88 126/86 129/77 133/87  Pulse: 90 84 92 92  Temp:    98.4 F (36.9 C)  TempSrc:    Oral  Resp: Height:      Weight:      SpO2: 98% 99% 100% 100%   Eyes: PER, lids and conjunctivae normal ENMT: Mucous membranes are moist. Posterior pharynx clear of any exudate or lesions.Normal dentition.  Neck: normal, supple, no masses Respiratory: clear to auscultation bilaterally, no wheezing, no crackles. Normal respiratory effort. No accessory muscle use.  Cardiovascular: Regular rate and rhythm, no murmurs / rubs / gallops. No extremity edema. 2+ pedal pulses. No carotid bruits.  Abdomen: no tenderness, no masses palpated. No hepatomegaly. Bowel sounds positive.  Musculoskeletal: no clubbing / cyanosis. No joint deformity upper and lower extremities. Good ROM, no contractures. Normal muscle tone.  Extremities: Old surgical scars to bilateral thighs. LLE without significant swelling.  Skin: no rashes, lesions, ulcers. No induration Neurologic: CN 2-12 grossly intact. Sensation intact, Strength 5/5 in all 4.  Psychiatric: Normal judgment and insight. Alert and oriented x 3. Normal mood.   Labs on Admission: I have personally reviewed following labs and imaging studies  CBC:  Recent Labs Lab 04/08/16 2035 04/09/16 0733  WBC 9.6 8.5  NEUTROABS 8.3*  --   HGB 12.6* 11.7*  HCT 36.1* 35.4*  MCV 77.1* 78.3  PLT 356 361   Basic Metabolic Panel:  Recent Labs Lab 04/08/16 2035  NA 132*  K 4.1  CL 89*  CO2 31  GLUCOSE 109*  BUN 9  CREATININE 0.80  CALCIUM 9.3   Radiological Exams on Admission: Ct Venogram Abd/pel  04/08/2016  CLINICAL DATA:  Patient was diagnosed with deep venous thrombosis today in the left leg. Back pain. EXAM: CT venogram  of  the abdomen, pelvis, and lower extremities TECHNIQUE: Axial CT images of the abdomen, pelvis, and bilateral lower extremities from hip to below the knee are obtained with contrast material during venous phase. Additional sagittal and coronal multiplanar reformatted images and maximal intensity projection images are obtained. CONTRAST:  100 mL Isovue 370 COMPARISON:  None. FINDINGS: CT venogram: There is dilatation and filling defect consistent with deep venous thrombosis involving the left tibioperoneal trunk, left popliteal vein, left deep femoral vein, left common femoral vein, left external iliac vein, and left internal iliac vein. Left deep femoral vein is also involved. The inferior vena cava and iliac veins are somewhat flattened appear patent. Right pelvic and right leg veins are patent. Left leg veins demonstrate infiltration in the fat around the venous structures possibly due to obstruction or possibly indicating infection. Thrombophlebitis is not excluded. Mild diffuse soft tissue  edema in the left leg. CT abdomen and pelvis: Lung bases are clear. The liver, spleen, gallbladder, pancreas, adrenal glands, kidneys, abdominal aorta, and retroperitoneal lymph nodes are unremarkable. Stomach, small bowel, and colon are not abnormally distended. No free air or free fluid in the abdomen. Pelvis: Bladder wall is not thickened. No pelvic mass or lymphadenopathy. No free or loculated pelvic fluid collections. Prostate gland is not enlarged. Edema in the soft tissues with superficial S collaterals. Scattered lymph nodes in the pelvis are not abnormally distended. Appendix is normal. No destructive bone lesions. IMPRESSION: Deep venous thrombosis demonstrated involving the left pelvic veins and deep venous system of the left leg, extending from the left external iliac, internal iliac, common femoral, superficial and deep femoral, popliteal, and tibial peroneal veins. Associated edema and collateral venous  structures demonstrated. Infiltration around the deep veins of the left leg may indicate edema or phlebitis. Electronically Signed   By: Burman Nieves M.D.   On: 04/08/2016 22:15   Colon pathology 2011 - Care Everywhere. Colonoscopy procedure report not found.  The ascending and descending colon show similar changes of active chronic \\line  colitis characterized by an expanded lamina propria containing numerous \\line  lymphoplasmacytic cells, eosinophils, and occasional neutrophils. The glandular \\line  architecture is slightly distorted with focal crypt branching. Focal acute \\line  cryptitis with occasional  crypt abscesses are present. The glands appear \\line  regenerative/reactive but no dysplasia is identified. 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 This may be secondary to abnormal vasculature system (patient born 4 months earlier, mother describes collapse of venous system at birth requiring deep cuts into thighs as infant??). Venogram reveals deep venous system of the left leg, extending from the left external iliac, internal iliac, common femoral, superficial and deep femoral, popliteal, and tibial peroneal veins.  Patient has had recurrent DVTs in RLE since age 70 and is maintained on Plavix. This is first LLE DVT. Patient with Crohn's are at increased risk of DVTs but sounds like Crohn's has been controlled with just first line therapy since diagnosis. -place in OBS - Medical bed -heparin gtt started in ED -IR consulted.  -May need Vascular Surgery input. Will await IR evaluation first  Crohn's Disease. Followed by Dr. Norma Fredrickson who was at [facility] but now with [facility].  Only found a colon path in Care Everywhere, no actual notes from Dr. Norma Fredrickson.  Left message with Dr. Horace Porteous  nurse at [facility] where patient was seen by Dr. Norma Fredrickson in October. Awaiting call back.  -No evidence for Crohn's flare at present -Continue home Lialda. -outpatient follow up with Dr. Norma Fredrickson. Patient has no scheduled follow up appointments    Schizoaffective disorder (HCC). Stable.  -Continue home psychiatric medications.   Microcytic anemia, mild. Hgb mid 11-mid 12.  -check iron studies, b12, folate      DVT prophylaxis:    Heparin gtt Code Status:   Full code   Family Communication: Mother in room. Plan of care discussed. Questions answered  Disposition Plan: Discharge home   in 24-48 hours         Consults called: Interventional Radiology Admission status:  Observation - Medical bed  Willette Cluster NP [facility] Pager [phone]  If 7PM-7AM, please contact night-coverage [email or web address] Password [email or web address]  04/09/2016, 8:14 AM

## 2016-04-09 NOTE — Progress Notes (Signed)
ANTICOAGULATION CONSULT NOTE - Initial Consult  Pharmacy Consult for Heparin Indication: DVT  Allergies  Allergen Reactions  . Ibuprofen     Messes stomach up  . Tylenol [Acetaminophen]     Messes stomach up    Patient Measurements: Height: 6\' 1"  (185.4 cm) Weight: 187 lb (84.823 kg) IBW/kg (Calculated) : 79.9 Heparin Dosing Weight: 84 kg  Vital Signs: Temp: 98.9 F (37.2 C) (05/16 2056) Temp Source: Oral (05/16 2056) BP: 134/78 mmHg (05/16 2056) Pulse Rate: 96 (05/16 2056)  Labs:  Recent Labs  04/08/16 2035 04/09/16 0733 04/09/16 1558 04/09/16 2037  HGB 12.6* 11.7*  --   --   HCT 36.1* 35.4*  --   --   PLT 356 361  --   --   APTT  --   --  34  --   LABPROT  --   --  15.1  --   INR  --   --  1.17  --   HEPARINUNFRC  --  0.14*  --  0.19*  CREATININE 0.80  --   --   --     Estimated Creatinine Clearance: 154 mL/min (by C-G formula based on Cr of 0.8).  Assessment: 29yo male with massive DVT of leg, on IV heparin. Heparin level 0.19, subtherapeutic on 1700 units/hr. CBC stable. No bleeding noted per chart. Verified with RN that drip has been running without problem  Goal of Therapy:  Heparin level 0.3-0.7 units/ml Monitor platelets by anticoagulation protocol: Yes   Plan:  Rebolus 2500 units Increase heparin rate to 1950 units/hr Recheck heparin level at 0500 F/u IR plans.    Thank you for allowing [place] to participate in this patients care. Signe Colt, PharmD Pager: [phone] 04/09/2016,9:44 PM

## 2016-04-10 ENCOUNTER — Inpatient Hospital Stay (HOSPITAL_COMMUNITY): Payer: Medicaid Other

## 2016-04-10 LAB — HEPARIN LEVEL (UNFRACTIONATED)
HEPARIN UNFRACTIONATED: 0.25 [IU]/mL — AB (ref 0.30–0.70)
HEPARIN UNFRACTIONATED: 0.52 [IU]/mL (ref 0.30–0.70)
Heparin Unfractionated: <0.1 IU/mL — ABNORMAL LOW (ref 0.30–0.70)

## 2016-04-10 LAB — FOLATE RBC
FOLATE, RBC: 862 ng/mL (ref >498)
Folate, Hemolysate: 284.6 ng/mL
Hematocrit: 33 % — ABNORMAL LOW (ref 37.5–51.0)

## 2016-04-10 LAB — BASIC METABOLIC PANEL
ANION GAP: 15 (ref 5–15)
CHLORIDE: 97 mmol/L — AB (ref 101–111)
CO2: 25 mmol/L (ref 22–32)
Calcium: 8.7 mg/dL — ABNORMAL LOW (ref 8.9–10.3)
Creatinine, Ser: 0.89 mg/dL (ref 0.61–1.24)
GFR calc Af Amer: >60 mL/min (ref >60)
Glucose, Bld: 97 mg/dL (ref 65–99)
POTASSIUM: 3.6 mmol/L (ref 3.5–5.1)
SODIUM: 137 mmol/L (ref 135–145)

## 2016-04-10 LAB — FIBRINOGEN
Fibrinogen: 525 mg/dL — ABNORMAL HIGH (ref 204–475)
Fibrinogen: 154 mg/dL — ABNORMAL LOW (ref 204–475)

## 2016-04-10 LAB — CBC
HEMATOCRIT: 33.5 % — AB (ref 39.0–52.0)
HEMATOCRIT: 33.2 % — AB (ref 39.0–52.0)
HEMOGLOBIN: 11.2 g/dL — AB (ref 13.0–17.0)
HEMOGLOBIN: 11.1 g/dL — AB (ref 13.0–17.0)
MCH: 26.4 pg (ref 26.0–34.0)
MCH: 26.5 pg (ref 26.0–34.0)
MCHC: 33.4 g/dL (ref 30.0–36.0)
MCHC: 33.4 g/dL (ref 30.0–36.0)
MCV: 78.8 fL (ref 78.0–100.0)
MCV: 79.2 fL (ref 78.0–100.0)
PLATELETS: 329 10*3/uL (ref 150–400)
Platelets: 316 10*3/uL (ref 150–400)
RBC: 4.25 MIL/uL (ref 4.22–5.81)
RBC: 4.19 MIL/uL — AB (ref 4.22–5.81)
RDW: 14.1 % (ref 11.5–15.5)
RDW: 14.1 % (ref 11.5–15.5)
WBC: 9.1 10*3/uL (ref 4.0–10.5)
WBC: 11.1 10*3/uL — AB (ref 4.0–10.5)

## 2016-04-10 LAB — CBC WITH DIFFERENTIAL/PLATELET
BASOS ABS: 0 10*3/uL (ref 0.0–0.1)
BASOS PCT: 0 %
Eosinophils Absolute: 0.2 10*3/uL (ref 0.0–0.7)
Eosinophils Relative: 2 %
HEMATOCRIT: 34.8 % — AB (ref 39.0–52.0)
HEMOGLOBIN: 11.6 g/dL — AB (ref 13.0–17.0)
LYMPHS PCT: 25 %
Lymphs Abs: 2.2 10*3/uL (ref 0.7–4.0)
MCH: 26.5 pg (ref 26.0–34.0)
MCHC: 33.3 g/dL (ref 30.0–36.0)
MCV: 79.5 fL (ref 78.0–100.0)
MONO ABS: 0.7 10*3/uL (ref 0.1–1.0)
Monocytes Relative: 8 %
NEUTROS ABS: 5.8 10*3/uL (ref 1.7–7.7)
NEUTROS PCT: 65 %
Platelets: 430 10*3/uL — ABNORMAL HIGH (ref 150–400)
RBC: 4.38 MIL/uL (ref 4.22–5.81)
RDW: 14.3 % (ref 11.5–15.5)
WBC: 8.8 10*3/uL (ref 4.0–10.5)

## 2016-04-10 LAB — MRSA PCR SCREENING: MRSA BY PCR: NEGATIVE

## 2016-04-10 MED ORDER — HYDROMORPHONE HCL 1 MG/ML IJ SOLN
1.0000 mg | INTRAMUSCULAR | Status: DC | PRN
  Administered 2016-04-10: 1 mg via INTRAVENOUS
  Administered 2016-04-10 – 2016-04-13 (×21): 2 mg via INTRAVENOUS
  Administered 2016-04-14: 1 mg via INTRAVENOUS
  Administered 2016-04-14 (×2): 2 mg via INTRAVENOUS
  Filled 2016-04-10 (×11): qty 2
  Filled 2016-04-10 (×2): qty 1
  Filled 2016-04-10 (×12): qty 2

## 2016-04-10 MED ORDER — IOPAMIDOL (ISOVUE-300) INJECTION 61%
INTRAVENOUS | Status: AC
  Administered 2016-04-10: 20 mL
  Filled 2016-04-10: qty 100

## 2016-04-10 MED ORDER — SODIUM CHLORIDE 0.9% FLUSH: 3.0000 mL | INTRAVENOUS | Status: DC | PRN

## 2016-04-10 MED ORDER — MIDAZOLAM HCL 2 MG/2ML IJ SOLN
INTRAMUSCULAR | Status: AC
  Filled 2016-04-10: qty 4

## 2016-04-10 MED ORDER — LIDOCAINE HCL 1 % IJ SOLN
INTRAMUSCULAR | Status: AC
  Filled 2016-04-10: qty 20

## 2016-04-10 MED ORDER — MIDAZOLAM HCL 2 MG/2ML IJ SOLN
INTRAMUSCULAR | Status: AC | PRN
  Administered 2016-04-10 (×2): 1 mg via INTRAVENOUS

## 2016-04-10 MED ORDER — HEPARIN BOLUS VIA INFUSION
2500.0000 [IU] | Freq: Once | INTRAVENOUS | Status: AC
  Administered 2016-04-10: 2500 [IU] via INTRAVENOUS
  Filled 2016-04-10: qty 2500

## 2016-04-10 MED ORDER — SODIUM CHLORIDE 0.9 % IV SOLN
INTRAVENOUS | Status: DC
  Administered 2016-04-10 – 2016-04-11 (×3): via INTRAVENOUS

## 2016-04-10 MED ORDER — WHITE PETROLATUM GEL
Status: AC
  Administered 2016-04-10: 17:00:00
  Filled 2016-04-10: qty 1

## 2016-04-10 MED ORDER — SODIUM CHLORIDE 0.9 % IV SOLN: INTRAVENOUS | Status: DC

## 2016-04-10 MED ORDER — ALTEPLASE 50 MG IV SOLR
1.0000 mg/h | Freq: Once | INTRAVENOUS | Status: AC
  Administered 2016-04-10: 1 mg/h via INTRAVENOUS
  Filled 2016-04-10: qty 24

## 2016-04-10 MED ORDER — FENTANYL CITRATE (PF) 100 MCG/2ML IJ SOLN
INTRAMUSCULAR | Status: AC
  Filled 2016-04-10: qty 4

## 2016-04-10 MED ORDER — FENTANYL CITRATE (PF) 100 MCG/2ML IJ SOLN
INTRAMUSCULAR | Status: AC | PRN
  Administered 2016-04-10 (×4): 50 ug via INTRAVENOUS

## 2016-04-10 MED ORDER — SODIUM CHLORIDE 0.9% FLUSH
3.0000 mL | Freq: Two times a day (BID) | INTRAVENOUS | Status: DC
  Administered 2016-04-10 – 2016-04-14 (×7): 3 mL via INTRAVENOUS

## 2016-04-10 MED ORDER — SODIUM CHLORIDE 0.9 % IV SOLN: 250.0000 mL | INTRAVENOUS | Status: DC | PRN

## 2016-04-10 NOTE — Progress Notes (Signed)
Dr. Rica Records notified of patient's temp 101.7, fibrinogen, cbc results, and current heart rate. Patient is alert and oriented. No bleeding noted on patient.

## 2016-04-10 NOTE — Progress Notes (Signed)
ANTICOAGULATION CONSULT NOTE Pharmacy Consult for Heparin Indication: DVT  Allergies  Allergen Reactions  . Ibuprofen     Messes stomach up  . Tylenol [Acetaminophen]     Messes stomach up    Patient Measurements: Height: 6\' 1"  (185.4 cm) Weight: 187 lb (84.823 kg) IBW/kg (Calculated) : 79.9 Heparin Dosing Weight: 84 kg  Vital Signs: Temp: 101.7 F (38.7 C) (05/17 1547) Temp Source: Oral (05/17 1547) BP: 126/83 mmHg (05/17 1600) Pulse Rate: 105 (05/17 1600)  Labs:  Recent Labs  04/08/16 2035  04/09/16 0733 04/09/16 1558 04/09/16 2037 04/10/16 0559 04/10/16 1450 04/10/16 1500  HGB 12.6*  --  11.7*  --   --  11.6* 11.2*  --   HCT 36.1*  --  35.4* 33.0*  --  34.8* 33.5*  --   PLT 356  --  361  --   --  430* 316  --   APTT  --   --   --  34  --   --   --   --   LABPROT  --   --   --  15.1  --   --   --   --   INR  --   --   --  1.17  --   --   --   --   HEPARINUNFRC  --   < > 0.14*  --  0.19* 0.25*  --  <0.10*  CREATININE 0.80  --   --   --   --  0.89  --   --   < > = values in this interval not displayed.  Estimated Creatinine Clearance: 138.4 mL/min (by C-G formula based on Cr of 0.89).  Assessment: 30 yo male with extensive DVT- undergoing EKOS (through 5/18 at 1100). Heparin level undetectable this afternoon. Per RN Juliette Alcide, no issues with the line and no bleeding or oozing noted. hgb 11.2, plts 316- stable.  Goal of Therapy:  Heparin level 0.3-0.7 units/ml Monitor platelets by anticoagulation protocol: Yes   Plan:  -Heparin 2500 units IV bolus, then increase heparin to 2350 units/hr -Check heparin level in 6 hours -Daily HL and CBC  Sanyiah Kanzler D. Misti Towle, PharmD, BCPS Clinical Pharmacist Pager: [phone] 04/10/2016 4:12 PM

## 2016-04-10 NOTE — Procedures (Signed)
LLE venogram shows extensive fem pop occlusive DVT, occlusive L iliac DVT Will start EKOS TPA lysis via L pop access, recheck in AM No complication No blood loss. See complete dictation in [facility].

## 2016-04-10 NOTE — Sedation Documentation (Signed)
Patient is resting comfortably. 

## 2016-04-10 NOTE — Sedation Documentation (Signed)
Patient denies pain and is resting comfortably.  

## 2016-04-10 NOTE — Progress Notes (Signed)
Pt's heparin drip increased to 22 ml/hr per pharmacy order, and pt being transported to IR at this time. Was told that pt will not come back to [facility] by IR staff, belongings packed and sent with pt's mother. Dr. Izola Price in room discussing pain regimen with pt, requested nursing to alternate PO and IV pain medication.   [place], [name]

## 2016-04-10 NOTE — Progress Notes (Signed)
ANTICOAGULATION CONSULT NOTE Pharmacy Consult for Heparin Indication: DVT  Allergies  Allergen Reactions  . Ibuprofen     Messes stomach up  . Tylenol [Acetaminophen]     Messes stomach up    Patient Measurements: Height:  (185.4 cm) Weight: 187 lb (84.823 kg) IBW/kg (Calculated) : 79.9 Heparin Dosing Weight: 84 kg  Vital Signs: Temp: 99.2 F (37.3 C) (05/17 0528) Temp Source: Oral (05/17 0528) BP: 142/81 mmHg (05/17 0528) Pulse Rate: 90 (05/17 0528)  Labs:  Recent Labs  04/08/16 2035 04/09/16 0733 04/09/16 1558 04/09/16 2037 04/10/16 0559  HGB 12.6* 11.7*  --   --  11.6*  HCT 36.1* 35.4*  --   --  34.8*  PLT 356 361  --   --  430*  APTT  --   --  34  --   --   LABPROT  --   --  15.1  --   --   INR  --   --  1.17  --   --   HEPARINUNFRC  --  0.14*  --  0.19* 0.25*  CREATININE 0.80  --   --   --  0.89    Estimated Creatinine Clearance: 138.4 mL/min (by C-G formula based on Cr of 0.89).  Assessment: 30 yo male with DVT for heparin  Goal of Therapy:  Heparin level 0.3-0.7 units/ml Monitor platelets by anticoagulation protocol: Yes   Plan:  Heparin 2500 units IV bolus, then increase heparin 2200 units/hr Check heparin level in 6 hours.  Geannie Risen, PharmD, BCPS     04/10/2016,8:00 AM

## 2016-04-10 NOTE — Progress Notes (Signed)
Report given to [name], RN on 44M. Pt's mother has his belongings.   [place], [name]

## 2016-04-10 NOTE — Progress Notes (Signed)
Patient ID: Mark Orozco, male   DOB: 25-Nov-1986, 30 y.o.   MRN: [id number]   PROGRESS NOTE    Lyall Lalla  NWG:[id number] DOB: 1986/09/03 DOA: 04/08/2016  PCP: [facility]    Brief Narrative:  30 y.o. male with medical history significant for premature birth (4 months early), Crohn's disease, recurrent RLE DVTs and schizoaffective disorder, bipolar type. Pt [facility] to [facility] for evaluation of progressive low back pain several days in duration. This has been associated with LE pain, left > right. Imaging studies notable for massive LLE DVT.   Assessment & Plan:   Acute LLE DVT - plan for thrombolysis today - will go to ICU post procedure for monitoring - currently on heparin drip  - provide analgesia as needed  - appreciate IR assistance   Crohn's Disease - Followed by Dr. Norma Fredrickson who was at [facility] but now with [facility] - Continue home Lialda. - outpatient follow up with Dr. Norma Fredrickson.   Schizoaffective disorder - Continue home psychiatric medications.   Microcytic anemia - mild. Hgb mid 11-mid 12.  - check iron studies, b12, folate   DVT prophylaxis: on Heparin drip  Code Status: Full  Family Communication: Patient and mother at bedside  Disposition Plan: Not ready for discharge yet   Consultants:   IR  Procedures:   None   Antimicrobials:   None   Subjective: LLE pain ? RLE pain.   Objective: Filed Vitals:   04/09/16 0516 04/09/16 1326 04/09/16 2056 04/10/16 0528  BP: 133/87 129/73 134/78 142/81  Pulse: 92 96 96 90  Temp: 98.4 F (36.9 C) 98.1 F (36.7 C) 98.9 F (37.2 C) 99.2 F (37.3 C)  TempSrc: Oral Oral Oral Oral  Resp: Height:      Weight:      SpO2: 100% 100% 99% 97%    Intake/Output Summary (Last 24 hours) at 04/10/16 0616 Last data filed at 04/10/16 0529  Gross per 24 hour  Intake 274.91 ml  Output   2075 ml  Net -1800.09 ml   Filed Weights   04/08/16 1846  Weight: 84.823 kg (187 lb)     Examination:  General exam: Appears calm and comfortable  Respiratory system: Clear to auscultation. Respiratory effort normal. Cardiovascular system: S1 & S2 heard, RRR. No JVD, rubs, gallops or clicks. No pedal edema. Gastrointestinal system: Abdomen is nondistended, soft and nontender.  Central nervous system: Alert and oriented. No focal neurological deficits. Extremities: Symmetric 5 x 5 power. Psychiatry: Judgement and insight appear normal. Mood & affect appropriate.   Data Reviewed: I have personally reviewed following labs and imaging studies  CBC:  Recent Labs Lab 04/08/16 2035 04/09/16 0733  WBC 9.6 8.5  NEUTROABS 8.3*  --   HGB 12.6* 11.7*  HCT 36.1* 35.4*  MCV 77.1* 78.3  PLT 356 361   Basic Metabolic Panel:  Recent Labs Lab 04/08/16 2035  NA 132*  K 4.1  CL 89*  CO2 31  GLUCOSE 109*  BUN 9  CREATININE 0.80  CALCIUM 9.3   Coagulation Profile:  Recent Labs Lab 04/09/16 1558  INR 1.17   Anemia Panel:  Recent Labs  04/09/16 1558  VITAMINB12 857  FERRITIN 1002*  TIBC 190*  IRON 21*  RETICCTPCT 0.9   Radiology Studies: Ct Venogram Abd/pel 04/08/2016 Deep venous thrombosis demonstrated involving the left pelvic veins and deep venous system of the left leg, extending from the left external iliac, internal iliac, common femoral, superficial and deep femoral,  popliteal, and tibial peroneal veins. Associated edema and collateral venous structures demonstrated. Infiltration around the deep veins of the left leg may indicate edema or phlebitis.   Scheduled Meds: . benztropine  0.5 mg Oral BID  . fluPHENAZine  5 mg Oral BID  . mesalamine  4.8 g Oral Q breakfast  . mirtazapine  30 mg Oral QHS  . Oxcarbazepine  300 mg Oral BID  . Vilazodone HCl  20 mg Oral Daily   Continuous Infusions: . sodium chloride 75 mL/hr at 04/10/16 0000  . heparin 1,950 Units/hr (04/10/16 0000)     LOS: 1 day   Time spent: 20 minutes   Debbora Presto,  MD [facility] Pager [phone]  If 7PM-7AM, please contact night-coverage [email or web address] Password [facility] 04/10/2016, 6:16 AM

## 2016-04-10 NOTE — Progress Notes (Signed)
ANTICOAGULATION CONSULT NOTE  Pharmacy Consult for Heparin Indication: DVT  Allergies  Allergen Reactions  . Ibuprofen     Messes stomach up  . Tylenol [Acetaminophen]     Messes stomach up    Patient Measurements: Height: 6\' 1"  (185.4 cm) Weight: 187 lb (84.823 kg) IBW/kg (Calculated) : 79.9 Heparin Dosing Weight: 84 kg  Vital Signs: Temp: 99.6 F (37.6 C) (05/17 2328) Temp Source: Oral (05/17 2328) BP: 132/82 mmHg (05/17 2300) Pulse Rate: 94 (05/17 2300)  Labs:  Recent Labs  04/08/16 2035  04/09/16 1558  04/10/16 0559 04/10/16 1450 04/10/16 1500 04/10/16 2105 04/10/16 2307  HGB 12.6*  < >  --   --  11.6* 11.2*  --  11.1*  --   HCT 36.1*  < > 33.0*  --  34.8* 33.5*  --  33.2*  --   PLT 356  < >  --   --  430* 316  --  329  --   APTT  --   --  34  --   --   --   --   --   --   LABPROT  --   --  15.1  --   --   --   --   --   --   INR  --   --  1.17  --   --   --   --   --   --   HEPARINUNFRC  --   < >  --   < > 0.25*  --  <0.10*  --  0.52  CREATININE 0.80  --   --   --  0.89  --   --   --   --   < > = values in this interval not displayed.  Estimated Creatinine Clearance: 138.4 mL/min (by C-G formula based on Cr of 0.89).  Assessment: 30 yo male with extensive DVT- undergoing EKOS (through 5/18 at 1100). Heparin level now therapeutic on 2350 units/hr. No bleeding noted.   Goal of Therapy:  Heparin level 0.3-0.7 units/ml Monitor platelets by anticoagulation protocol: Yes   Plan:  -Continue heparin at 2350 units/hr -F/u daily heparin level to confirm therapeutic  Christoper Fabian, PharmD, BCPS Clinical pharmacist, pager [phone] 04/10/2016 11:45 PM

## 2016-04-11 ENCOUNTER — Inpatient Hospital Stay (HOSPITAL_COMMUNITY): Payer: Medicaid Other

## 2016-04-11 LAB — CBC
HEMATOCRIT: 34.8 % — AB (ref 39.0–52.0)
HEMATOCRIT: 32.7 % — AB (ref 39.0–52.0)
HEMATOCRIT: 29.7 % — AB (ref 39.0–52.0)
HEMOGLOBIN: 11.5 g/dL — AB (ref 13.0–17.0)
HEMOGLOBIN: 9.6 g/dL — AB (ref 13.0–17.0)
Hemoglobin: 10.6 g/dL — ABNORMAL LOW (ref 13.0–17.0)
MCH: 26.1 pg (ref 26.0–34.0)
MCH: 26.3 pg (ref 26.0–34.0)
MCH: 25.7 pg — AB (ref 26.0–34.0)
MCHC: 33 g/dL (ref 30.0–36.0)
MCHC: 32.4 g/dL (ref 30.0–36.0)
MCHC: 32.3 g/dL (ref 30.0–36.0)
MCV: 78.9 fL (ref 78.0–100.0)
MCV: 81.1 fL (ref 78.0–100.0)
MCV: 79.4 fL (ref 78.0–100.0)
Platelets: 343 10*3/uL (ref 150–400)
Platelets: 281 10*3/uL (ref 150–400)
Platelets: 238 10*3/uL (ref 150–400)
RBC: 4.41 MIL/uL (ref 4.22–5.81)
RBC: 4.03 MIL/uL — ABNORMAL LOW (ref 4.22–5.81)
RBC: 3.74 MIL/uL — ABNORMAL LOW (ref 4.22–5.81)
RDW: 14.3 % (ref 11.5–15.5)
RDW: 14.4 % (ref 11.5–15.5)
RDW: 14.6 % (ref 11.5–15.5)
WBC: 10.2 10*3/uL (ref 4.0–10.5)
WBC: 9.2 10*3/uL (ref 4.0–10.5)
WBC: 7.3 10*3/uL (ref 4.0–10.5)

## 2016-04-11 LAB — BASIC METABOLIC PANEL
ANION GAP: 9 (ref 5–15)
CHLORIDE: 101 mmol/L (ref 101–111)
CO2: 27 mmol/L (ref 22–32)
Calcium: 8 mg/dL — ABNORMAL LOW (ref 8.9–10.3)
Creatinine, Ser: 0.84 mg/dL (ref 0.61–1.24)
GLUCOSE: 111 mg/dL — AB (ref 65–99)
Potassium: 3.6 mmol/L (ref 3.5–5.1)
Sodium: 137 mmol/L (ref 135–145)

## 2016-04-11 LAB — FIBRINOGEN
Fibrinogen: 366 mg/dL (ref 204–475)
Fibrinogen: 425 mg/dL (ref 204–475)
Fibrinogen: 262 mg/dL (ref 204–475)

## 2016-04-11 LAB — HEPARIN LEVEL (UNFRACTIONATED)
Heparin Unfractionated: 0.49 IU/mL (ref 0.30–0.70)
Heparin Unfractionated: 0.38 IU/mL (ref 0.30–0.70)

## 2016-04-11 MED ORDER — HYDROMORPHONE HCL 1 MG/ML IJ SOLN
INTRAMUSCULAR | Status: AC
  Administered 2016-04-11: 2 mg via INTRAVENOUS
  Filled 2016-04-11: qty 1

## 2016-04-11 MED ORDER — MIDAZOLAM HCL 2 MG/2ML IJ SOLN
INTRAMUSCULAR | Status: AC | PRN
  Administered 2016-04-11: 1 mg via INTRAVENOUS

## 2016-04-11 MED ORDER — FENTANYL CITRATE (PF) 100 MCG/2ML IJ SOLN
INTRAMUSCULAR | Status: AC
  Filled 2016-04-11: qty 4

## 2016-04-11 MED ORDER — IOPAMIDOL (ISOVUE-300) INJECTION 61%
INTRAVENOUS | Status: AC
  Administered 2016-04-11: 75 mL
  Filled 2016-04-11: qty 100

## 2016-04-11 MED ORDER — FENTANYL CITRATE (PF) 100 MCG/2ML IJ SOLN
INTRAMUSCULAR | Status: AC | PRN
  Administered 2016-04-11: 12.5 ug via INTRAVENOUS

## 2016-04-11 MED ORDER — IOPAMIDOL (ISOVUE-300) INJECTION 61%
INTRAVENOUS | Status: AC
  Administered 2016-04-11: 15 mL
  Filled 2016-04-11: qty 50

## 2016-04-11 MED ORDER — MIDAZOLAM HCL 2 MG/2ML IJ SOLN
INTRAMUSCULAR | Status: AC
  Filled 2016-04-11: qty 4

## 2016-04-11 MED ORDER — SODIUM CHLORIDE 0.9% FLUSH
3.0000 mL | Freq: Two times a day (BID) | INTRAVENOUS | Status: DC
  Administered 2016-04-11 – 2016-04-14 (×6): 3 mL via INTRAVENOUS

## 2016-04-11 MED ORDER — TENECTEPLASE 50 MG IV KIT
0.4000 mg/h | PACK | INTRAVENOUS | Status: AC
  Administered 2016-04-11 – 2016-04-12 (×3): 0.4 mg/h
  Filled 2016-04-11 (×4): qty 0.5

## 2016-04-11 MED ORDER — LIDOCAINE HCL 1 % IJ SOLN
INTRAMUSCULAR | Status: AC
  Administered 2016-04-11: 10 mL
  Filled 2016-04-11: qty 20

## 2016-04-11 MED ORDER — SODIUM CHLORIDE 0.9% FLUSH: 3.0000 mL | INTRAVENOUS | Status: DC | PRN

## 2016-04-11 MED ORDER — HYDROMORPHONE HCL 1 MG/ML IJ SOLN
INTRAMUSCULAR | Status: AC
  Filled 2016-04-11: qty 1

## 2016-04-11 MED ORDER — SODIUM CHLORIDE 0.9 % IV SOLN: 250.0000 mL | INTRAVENOUS | Status: DC | PRN

## 2016-04-11 MED ORDER — CHLORHEXIDINE GLUCONATE 4 % EX LIQD
CUTANEOUS | Status: AC
  Filled 2016-04-11: qty 15

## 2016-04-11 NOTE — Sedation Documentation (Signed)
Patient is resting comfortably. 

## 2016-04-11 NOTE — Progress Notes (Signed)
ANTICOAGULATION CONSULT NOTE Pharmacy Consult for Heparin Indication: DVT  Allergies  Allergen Reactions  . Ibuprofen     Messes stomach up  . Tylenol [Acetaminophen]     Messes stomach up    Patient Measurements: Height: 6\' 1"  (185.4 cm) Weight: 187 lb (84.823 kg) IBW/kg (Calculated) : 79.9 Heparin Dosing Weight: 84 kg  Vital Signs: Temp: 99.6 F (37.6 C) (05/18 0728) Temp Source: Oral (05/18 0728) BP: 133/70 mmHg (05/18 0700) Pulse Rate: 102 (05/18 0700)  Labs:  Recent Labs  04/08/16 2035  04/09/16 1558  04/10/16 0559 04/10/16 1450 04/10/16 1500 04/10/16 2105 04/10/16 2307 04/11/16 0350  HGB 12.6*  < >  --   --  11.6* 11.2*  --  11.1*  --  11.5*  HCT 36.1*  < > 33.0*  --  34.8* 33.5*  --  33.2*  --  34.8*  PLT 356  < >  --   --  430* 316  --  329  --  343  APTT  --   --  34  --   --   --   --   --   --   --   LABPROT  --   --  15.1  --   --   --   --   --   --   --   INR  --   --  1.17  --   --   --   --   --   --   --   HEPARINUNFRC  --   < >  --   < > 0.25*  --  <0.10*  --  0.52 0.49  CREATININE 0.80  --   --   --  0.89  --   --   --   --  0.84  < > = values in this interval not displayed.  Estimated Creatinine Clearance: 146.6 mL/min (by C-G formula based on Cr of 0.84).  Assessment: 30 yo male with extensive DVT- undergoing EKOS through 5/18 at 1100. Heparin level now therapeutic x2 on current rate. CBC stable.   Goal of Therapy:  Heparin level 0.3-0.7 units/ml Monitor platelets by anticoagulation protocol: Yes   Plan:  -Continue heparin at 2350 units/hr -Daily HL and CBC -F/u plan after EKOS for PO anticoagulation     Agapito Games, PharmD, BCPS Clinical Pharmacist 04/11/2016 7:35 AM

## 2016-04-11 NOTE — Progress Notes (Signed)
ANTICOAGULATION CONSULT NOTE Pharmacy Consult for Heparin Indication: DVT  Allergies  Allergen Reactions  . Ibuprofen     Messes stomach up  . Tylenol [Acetaminophen]     Messes stomach up    Patient Measurements: Height:  (185.4 cm) Weight: 187 lb (84.823 kg) IBW/kg (Calculated) : 79.9 Heparin Dosing Weight: 84 kg  Vital Signs: Temp: 99.4 F (37.4 C) (05/18 1134) Temp Source: Oral (05/18 1134) BP: 123/71 mmHg (05/18 1900) Pulse Rate: 99 (05/18 1907)  Labs:  Recent Labs  04/08/16 2035  04/09/16 1558  04/10/16 0559  04/10/16 2307 04/11/16 0350 04/11/16 1027 04/11/16 1924 04/11/16 1925  HGB 12.6*  < >  --   --  11.6*  < >  --  11.5* 10.6*  --  9.6*  HCT 36.1*  < > 33.0*  --  34.8*  < >  --  34.8* 32.7*  --  29.7*  PLT 356  < >  --   --  430*  < >  --  343 281  --  238  APTT  --   --  34  --   --   --   --   --   --   --   --   LABPROT  --   --  15.1  --   --   --   --   --   --   --   --   INR  --   --  1.17  --   --   --   --   --   --   --   --   HEPARINUNFRC  --   < >  --   < > 0.25*  < > 0.52 0.49  --  0.38  --   CREATININE 0.80  --   --   --  0.89  --   --  0.84  --   --   --   < > = values in this interval not displayed.  Estimated Creatinine Clearance: 146.6 mL/min (by C-G formula based on Cr of 0.84).  Assessment: 30 yo male with extensive DVT- undergoing EKOS through 5/18 at 1100 (alteplase completed).   5/18 PM: Mechanical thrombectomy of DVT with Angiojet to persistent clot in upper femoral and iliac veins. Baloon angioplasty of upper femoral vein and CFV. Placement of new infusion catheter. Switched to a traditional infusion catheter and started on TNK. Heparin was continued and HL 0.38 in goal 0.2-0.5  Goal of Therapy:  HL 0.2-0.5 while on thrombolytic Monitor platelets by anticoagulation protocol: Yes   Plan:  Continue IV heparin at 2350 units/hr. Next HL in 6 hrs. Tenecteplase started at 0.4mg /hr.     Vernis Eid S. Merilynn Finland, PharmD,  [facility] Clinical Staff Pharmacist Pager [phone]  04/11/2016 7:51 PM

## 2016-04-11 NOTE — Procedures (Signed)
Post-Procedure Note  Pre-operative Diagnosis: Left LE DVT and catheter-directed thrombolysis       Post-operative Diagnosis: Same   Indications: Left LE DVT  Procedure Details:   Left LE venogram.   Mechanical thrombectomy of DVT with Angiojet.  Balloon angioplasty of upper femoral vein and CFV.  Placement of new infusion catheter.  Findings: Improved flow in left femoral vein.  Persistent clot in upper femoral vein and iliac veins treated with Angiojet and balloon angioplasty.  Improved flow after intervention but still large amount of clot in iliac venous system.  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  Patent large collateral veins in pelvis and abdomen.  Complications: None     Condition: stable  Plan: Continue catheter directed thrombolysis in left LE.  Will switch to traditional infusion catheter and use TNK.

## 2016-04-11 NOTE — Sedation Documentation (Signed)
IR techs removing dsg to L leg- pt states he is in lot of pain from that leg-MD aware. Gave PRN med as ordered on floor.

## 2016-04-11 NOTE — Progress Notes (Signed)
Patient ID: Mark Orozco, male   DOB: January 12, 1986, 30 y.o.   MRN: [id number]   PROGRESS NOTE    Cynthia Hake  RAX:[id number] DOB: [birth year] DOA: 04/08/2016  PCP: [facility]    Brief Narrative:  30 y.o. male with medical history significant for premature birth (4 months early), Crohn's disease, recurrent RLE DVTs and schizoaffective disorder, bipolar type. Pt presented to [facility] for evaluation of progressive low back pain several days in duration. This has been associated with LE pain, left > right. Imaging studies notable for massive LLE DVT.   Assessment & Plan:   Acute LLE DVT - s/p Left LE venogram, mechanical thrombectomy of DVT with Angiojet. Balloon angioplasty of upper femoral vein and CFV, placement of new infusion catheter - currently on heparin drip  - provide analgesia as needed  - appreciate IR assistance   Post op fever - up to 101.57F, now resolved and no fevers since last night - will continue to monitor closely - may need further post op fever work up if persistently febrile   Crohn's Disease - Followed by Dr. Norma Fredrickson who was at [facility] but now with [facility] - Continue home Lialda. - outpatient follow up with Dr. Norma Fredrickson.   Schizoaffective disorder - Continue home psychiatric medications.   Microcytic anemia - mild. Hgb drop post procedure - no signs of active bleeding  - CBC in AM   DVT prophylaxis: on Heparin drip  Code Status: Full  Family Communication: Patient and mother at bedside  Disposition Plan: Not ready for discharge yet   Consultants:   IR  Procedures:   None   Antimicrobials:   None   Subjective: LLE pain > RLE pain.   Objective: Filed Vitals:   04/11/16 1241 04/11/16 1300 04/11/16 1314 04/11/16 1400  BP:  118/76  115/71  Pulse: 92 88 86 86  Temp:      TempSrc:      Resp: 11 8 8 14   Height:      Weight:      SpO2: 99% 96% 95% 97%    Intake/Output Summary (Last 24 hours) at 04/11/16 1452 Last data  filed at 04/11/16 1400  Gross per 24 hour  Intake   3822 ml  Output   3800 ml  Net     22 ml   Filed Weights   04/08/16 1846  Weight: 84.823 kg (187 lb)    Examination:  General exam: Appears calm and comfortable  Respiratory system: Clear to auscultation. Respiratory effort normal. Cardiovascular system: S1 & S2 heard, RRR. No JVD, rubs, gallops or clicks. No pedal edema. Gastrointestinal system: Abdomen is nondistended, soft and nontender.    Data Reviewed: I have personally reviewed following labs and imaging studies  CBC:  Recent Labs Lab 04/08/16 2035  04/10/16 0559 04/10/16 1450 04/10/16 2105 04/11/16 0350 04/11/16 1027  WBC 9.6  < > 8.8 9.1 11.1* 10.2 9.2  NEUTROABS 8.3*  --  5.8  --   --   --   --   HGB 12.6*  < > 11.6* 11.2* 11.1* 11.5* 10.6*  HCT 36.1*  < > 34.8* 33.5* 33.2* 34.8* 32.7*  MCV 77.1*  < > 79.5 78.8 79.2 78.9 81.1  PLT 356  < > 430* 316 329 343 281  < > = values in this interval not displayed. Basic Metabolic Panel:  Recent Labs Lab 04/08/16 2035 04/10/16 0559 04/11/16 0350  NA 132* 137 137  K 4.1 3.6 3.6  CL 89* 97* 101  CO2 GLUCOSE 109* 97 111*  BUN 9 <5* <5*  CREATININE 0.80 0.89 0.84  CALCIUM 9.3 8.7* 8.0*   Coagulation Profile:  Recent Labs Lab 04/09/16 1558  INR 1.17   Anemia Panel:  Recent Labs  04/09/16 1558  VITAMINB12 857  FERRITIN 1002*  TIBC 190*  IRON 21*  RETICCTPCT 0.9   Radiology Studies: Ct Venogram Abd/pel 04/08/2016 Deep venous thrombosis demonstrated involving the left pelvic veins and deep venous system of the left leg, extending from the left external iliac, internal iliac, common femoral, superficial and deep femoral, popliteal, and tibial peroneal veins. Associated edema and collateral venous structures demonstrated. Infiltration around the deep veins of the left leg may indicate edema or phlebitis.   Scheduled Meds: . benztropine  0.5 mg Oral BID  . fluPHENAZine  5 mg Oral BID  .  mesalamine  4.8 g Oral Q breakfast  . mirtazapine  30 mg Oral QHS  . Oxcarbazepine  300 mg Oral BID  . sodium chloride flush  3 mL Intravenous Q12H  . Vilazodone HCl  20 mg Oral Daily   Continuous Infusions: . sodium chloride 75 mL/hr at 04/11/16 0224  . sodium chloride 35 mL/hr at 04/11/16 0033  . sodium chloride 10 mL/hr at 04/11/16 1030  . heparin 2,350 Units/hr (04/11/16 1108)     LOS: 2 days   Time spent: 20 minutes   Debbora Presto, MD [facility] Pager [phone]  If 7PM-7AM, please contact night-coverage [email or web address] Password [email or web address] 04/11/2016, 2:52 PM

## 2016-04-12 ENCOUNTER — Inpatient Hospital Stay (HOSPITAL_COMMUNITY): Payer: Medicaid Other

## 2016-04-12 LAB — CBC
HEMATOCRIT: 33.8 % — AB (ref 39.0–52.0)
HEMATOCRIT: 31.6 % — AB (ref 39.0–52.0)
HEMOGLOBIN: 10.8 g/dL — AB (ref 13.0–17.0)
HEMOGLOBIN: 10 g/dL — AB (ref 13.0–17.0)
MCH: 25.5 pg — ABNORMAL LOW (ref 26.0–34.0)
MCH: 25.1 pg — ABNORMAL LOW (ref 26.0–34.0)
MCHC: 32 g/dL (ref 30.0–36.0)
MCHC: 31.6 g/dL (ref 30.0–36.0)
MCV: 79.7 fL (ref 78.0–100.0)
MCV: 79.4 fL (ref 78.0–100.0)
Platelets: 282 10*3/uL (ref 150–400)
Platelets: 284 10*3/uL (ref 150–400)
RBC: 4.24 MIL/uL (ref 4.22–5.81)
RBC: 3.98 MIL/uL — AB (ref 4.22–5.81)
RDW: 14.5 % (ref 11.5–15.5)
RDW: 14.4 % (ref 11.5–15.5)
WBC: 9.2 10*3/uL (ref 4.0–10.5)
WBC: 8.4 10*3/uL (ref 4.0–10.5)

## 2016-04-12 LAB — HEPARIN LEVEL (UNFRACTIONATED)
HEPARIN UNFRACTIONATED: 0.52 [IU]/mL (ref 0.30–0.70)
Heparin Unfractionated: 0.11 IU/mL — ABNORMAL LOW (ref 0.30–0.70)
Heparin Unfractionated: 0.61 IU/mL (ref 0.30–0.70)

## 2016-04-12 LAB — BASIC METABOLIC PANEL
Anion gap: 7 (ref 5–15)
CHLORIDE: 103 mmol/L (ref 101–111)
CO2: 29 mmol/L (ref 22–32)
CREATININE: 0.89 mg/dL (ref 0.61–1.24)
Calcium: 8.3 mg/dL — ABNORMAL LOW (ref 8.9–10.3)
GFR calc Af Amer: >60 mL/min (ref >60)
GFR calc non Af Amer: >60 mL/min (ref >60)
Glucose, Bld: 111 mg/dL — ABNORMAL HIGH (ref 65–99)
Potassium: 3.8 mmol/L (ref 3.5–5.1)
SODIUM: 139 mmol/L (ref 135–145)

## 2016-04-12 LAB — FIBRINOGEN
Fibrinogen: 292 mg/dL (ref 204–475)
Fibrinogen: 355 mg/dL (ref 204–475)
Fibrinogen: 347 mg/dL (ref 204–475)
Fibrinogen: 353 mg/dL (ref 204–475)

## 2016-04-12 MED ORDER — FENTANYL CITRATE (PF) 100 MCG/2ML IJ SOLN
INTRAMUSCULAR | Status: AC
  Filled 2016-04-12: qty 2

## 2016-04-12 MED ORDER — LIDOCAINE HCL 1 % IJ SOLN
INTRAMUSCULAR | Status: AC | PRN
  Administered 2016-04-12: 8 mL

## 2016-04-12 MED ORDER — LIDOCAINE HCL 1 % IJ SOLN
INTRAMUSCULAR | Status: AC
  Filled 2016-04-12: qty 20

## 2016-04-12 MED ORDER — IOPAMIDOL (ISOVUE-300) INJECTION 61%
INTRAVENOUS | Status: AC
  Administered 2016-04-12: 70 mL
  Filled 2016-04-12: qty 100

## 2016-04-12 MED ORDER — FENTANYL CITRATE (PF) 100 MCG/2ML IJ SOLN
INTRAMUSCULAR | Status: AC | PRN
  Administered 2016-04-12: 100 ug via INTRAVENOUS

## 2016-04-12 NOTE — Progress Notes (Signed)
[facility]                                                                              Patient Demographics  Mark Orozco, is a 30 y.o. male, DOB - Nov 28, 1985, ZOX:[id number]  Admit date - 04/08/2016   Admitting Physician Hillary Bow, DO  Outpatient Primary MD for the patient is No primary care provider on file.  Outpatient specialists:   LOS - 3  days    Chief Complaint  Patient presents with  . DVT       Brief summary   30 y.o. male with medical history significant for premature birth (4 months early), Crohn's disease, recurrent RLE DVTs and schizoaffective disorder, bipolar type. Pt presented to [facility] for evaluation of progressive low back pain several days in duration. This has been associated with LE pain, left > right. Imaging studies notable for massive LLE DVT. IR was consulted, patient underwent mechanical thrombectomy and angioplasty, placement of new infusion catheter by interventional radiology.   Assessment & Plan    Acute LLE DVT - IR consulted, s/p Left LE venogram, mechanical thrombectomy of DVT with Angiojet. Balloon angioplasty of upper femoral vein and CFV, placement of new infusion catheter - currently on heparin drip  - provide analgesia as needed, management with IR guidance    Post op fever- improved - Mild low-grade temp of 99.32F today at 11am, no leukocytosis, no infectious source or symptoms - will check blood cultures  -  may need further post op fever work up if persistently febrile   Crohn's Disease - Followed by Dr. Norma Fredrickson who was at [facility] but now with [facility] - Continue home Lialda. - outpatient follow up with Dr. Norma Fredrickson.   Schizoaffective disorder - Continue home psychiatric medications.   Microcytic anemia - mild. H&H currently stable.  Code Status: Full code  DVT Prophylaxis:  Heparin drip   Family Communication: Discussed in detail with the patient, all imaging results, lab results  explained to the patient   Disposition Plan:  Time Spent in minute 25 minutes  Procedures:   mechanical thrombectomy and angioplasty, placement of new infusion catheter    Consultants:   Interventional radiology  Antimicrobials:   None    Medications  Scheduled Meds: . benztropine  0.5 mg Oral BID  . fluPHENAZine  5 mg Oral BID  . mesalamine  4.8 g Oral Q breakfast  . mirtazapine  30 mg Oral QHS  . Oxcarbazepine  300 mg Oral BID  . sodium chloride flush  3 mL Intravenous Q12H  . sodium chloride flush  3 mL Intravenous Q12H  . Vilazodone HCl  20 mg Oral Daily   Continuous Infusions: . sodium chloride 75 mL/hr at 04/12/16 0433  . sodium chloride 35 mL/hr at 04/11/16 1514  . sodium chloride 10 mL/hr at 04/11/16 1030  . heparin 2,450 Units/hr (04/12/16 0915)  . tenecteplase (TNKase) infusion 0.4 mg/hr (04/12/16 0852)   PRN Meds:.sodium chloride, sodium chloride, bisacodyl, HYDROmorphone (DILAUDID) injection, ondansetron **OR** ondansetron (ZOFRAN) IV, oxyCODONE, polyethylene glycol, sodium chloride flush, sodium chloride flush   Antibiotics   Anti-infectives    None  Subjective:   Mark Orozco was seen and examined today.  Complaining of pain, feeling thirsty and hungry. Otherwise improving Patient denies dizziness, chest pain, shortness of breath, abdominal pain, N/V/D/C, new weakness, numbess, tingling. No acute events overnight.    Objective:   Filed Vitals:   04/12/16 1000 04/12/16 1100 04/12/16 1147 04/12/16 1200  BP: 110/68 103/65  112/68  Pulse: 88 82  91  Temp:   99.1 F (37.3 C)   TempSrc:   Oral   Resp: 18 14  17   Height:      Weight:      SpO2: 95% 97%  97%    Intake/Output Summary (Last 24 hours) at 04/12/16 1216 Last data filed at 04/12/16 1200  Gross per 24 hour  Intake 3785.54 ml  Output   2100 ml  Net 1685.54 ml     Wt Readings from Last 3 Encounters:  04/08/16 84.823 kg (187 lb)     Exam  General: Alert and oriented x  3, NAD  HEENT:  PERRLA, EOMI, Anicteric Sclera, mucous membranes moist.   Neck: Supple, no JVD, no masses  Cardiovascular: S1 S2 auscultated, no rubs, murmurs or gallops. Regular rate and rhythm.  Respiratory: Clear to auscultation bilaterally, no wheezing, rales or rhonchi  Gastrointestinal: Soft, nontender, nondistended, + bowel sounds  Ext: no cyanosis clubbing.  Neuro:No focal neurological deficits  Skin: No rashes  Psych: Normal affect and demeanor, alert and oriented x3    Data Reviewed:  I have personally reviewed following labs and imaging studies  Micro Results Recent Results (from the past 240 hour(s))  MRSA PCR Screening     Status: None   Collection Time: 04/10/16 11:13 AM  Result Value Ref Range Status   MRSA by PCR NEGATIVE NEGATIVE Final    Comment:        The GeneXpert MRSA Assay (FDA approved for NASAL specimens only), is one component of a comprehensive MRSA colonization surveillance program. It is not intended to diagnose MRSA infection nor to guide or monitor treatment for MRSA infections.     Radiology Reports Ir Veno/ext/uni Left  04/10/2016  CLINICAL DATA:  History of multiple femoral venous catheterization such as a premature neonate. History of right lower extremity venous thrombosis. 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 MEDICATIONS: Lidocaine 1% subcutaneous CONTRAST:  20mL ISOVUE-300 IOPAMIDOL (ISOVUE-300) INJECTION 61% PROCEDURE: The procedure,  risks (including but not limited to bleeding, infection, organ damage ), benefits, and alternatives were explained to the patient and family. Questions regarding the procedure were encouraged and answered. The patient understands and consents to the procedure. Patient placed prone on the procedure table. Left leg prepped and draped in usual sterile fashion. Maximal barrier sterile technique was utilized including caps, mask, sterile gowns, sterile gloves, sterile drape, hand hygiene and skin antiseptic. After time-out, moderate sedation was initiated. After the skin was infiltrated with lidocaine, the left posterior tibial vein was accessed under ultrasound guidance with a 21 gauge micropuncture set at the proximal calf level. Venography was performed. The micropuncture dilator was exchanged for a 6 [place] vascular sheath, through which a  5 French angled angiographic catheter was advanced for left lower extremity venography. The catheter was exchanged over a guidewire for the 6 French EKOS infusion device, placed from the popliteal vein to the left common iliac vein. Catheter and sheath were secured externally and catheter directed ultrasound assisted thrombolytic infusion was initiated. Patient transferred to ICU for observation. The patient tolerated the procedure well. COMPLICATIONS: None immediate FINDINGS: Initial left lower extremity venography demonstrated occlusive DVT extending from posterior tibial veins through the popliteal, femoral, common femoral, external iliac, and common iliac veins. Direct communication to the IVC was not demonstrated, consistent with recent CT findings. An infusion catheters placed from the popliteal vein to the left common iliac vein and ultrasound assisted venous thrombo lysis was initiated. IMPRESSION: 1. Extensive occlusive acute thrombus through the posterior tibial, popliteal, femoral, common femoral, left external and common iliac veins. 2. 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 3. Initiation of catheter directed ultrasound assisted thrombolysis of the extensive left lower extremity and pelvic DVT as above. Follow-up venography planned in the morning. Electronically Signed   By: Corlis Leak M.D.   On: 04/10/2016 12:49   Ir Thrombect Veno Mech Mod Sed  04/11/2016  INDICATION: 30 year old with left lower extremity DVT. One day follow-up of catheter directed thrombolytic therapy. EXAM: FOLLOW-UP OF CATHETER DIRECTED THROMBOLYTIC THERAPY - SUBSEQUENT DAY TRANSCATHETER MECHANICAL THROMBECTOMY VENOUS BALLOON ANGIOPLASTY COMPARISON:  None. MEDICATIONS: None. ANESTHESIA/SEDATION: Versed 1 mg IV; Fentanyl 12.5 mcg IV Moderate Sedation Time:  60 minutes The patient was continuously monitored during the procedure by the interventional radiology nurse under my direct supervision. FLUOROSCOPY TIME:  Fluoroscopy Time: 7 minutes 30 seconds (66 mGy). COMPLICATIONS: None immediate. TECHNIQUE: Patient was placed prone on the interventional table. The existing catheter and the back of the left leg were prepped and draped in a sterile fashion. Maximal barrier sterile technique was utilized including caps, mask, sterile gowns, sterile gloves, sterile drape, hand hygiene and skin antiseptic. Contrast was injected through the 6 French sheath side port. Left lower extremity venogram was performed. The EKOS catheter was removed and a 5 French catheter was advanced into the upper leg and additional venography was performed. A Rosen wire was placed. The iliac veins and upper femoral vein underwent mechanical thrombectomy with the Angiojet device. Follow-up venography was performed. The upper femoral vein and common femoral vein were treated with 6 mm x 40 mm balloon. Follow-up venography was performed. A ew 90 cm infusion catheter was placed. This catheter has 40 cm of infusion length. Tip of the catheter was placed in the  left iliac veins. Catheter directed thrombolysis was continued using tenecteplase. FINDINGS: There was improved flow in the left femoral vein. There was persistent occlusion in the upper femoral vein and the iliac veins. Patent collateral veins in the upper pelvis that appear to be draining through the lumbar venous system. 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 Following thrombectomy and balloon angioplasty, there was flow throughout the left femoral vein with some residual nonocclusive clot. However, there continued to be a large clot burden in the common femoral vein and external iliac vein. Therefore, the infusion catheter was placed. IMPRESSION: Improved venous flow in the left lower extremity and left iliac veins following catheter directed thrombolysis, mechanical thrombectomy and balloon angioplasty. There continues to be a large clot burden in the pelvis. Therefore, will continue  catheter directed thrombolysis for at least 1 more day. Electronically Signed   By: Richarda Overlie M.D.   On: 04/11/2016 17:50   Ir US Guide Vasc Access Left  04/10/2016  CLINICAL DATA:  History of multiple femoral venous catheterization such as a premature neonate. History of right lower extremity venous thrombosis. 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 MEDICATIONS: Lidocaine 1% subcutaneous CONTRAST:  20mL ISOVUE-300 IOPAMIDOL (ISOVUE-300) INJECTION 61%  PROCEDURE: The procedure, risks (including but not limited to bleeding, infection, organ damage ), benefits, and alternatives were explained to the patient and family. Questions regarding the procedure were encouraged and answered. The patient understands and consents to the procedure. Patient placed prone on the procedure table. Left leg prepped and draped in usual sterile fashion. Maximal barrier sterile technique was utilized including caps, mask, sterile gowns, sterile gloves, sterile drape, hand hygiene and skin antiseptic. After time-out, moderate sedation was initiated. After the skin was infiltrated with lidocaine, the left posterior tibial vein was accessed under ultrasound guidance with a 21 gauge micropuncture set at the proximal calf level. Venography was performed. The micropuncture dilator was exchanged for a 6 [place] vascular sheath, through which a 5 French angled angiographic catheter was advanced for left lower extremity venography. The catheter was exchanged over a guidewire for the 6 French EKOS infusion device, placed from the popliteal vein to the left common iliac vein. Catheter and sheath were secured externally and catheter directed ultrasound assisted thrombolytic infusion was initiated. Patient transferred to ICU for observation. The patient tolerated the procedure well. COMPLICATIONS: None immediate FINDINGS: Initial left lower extremity venography demonstrated occlusive DVT extending from posterior tibial veins through the popliteal, femoral, common femoral, external iliac, and common iliac veins. Direct communication to the IVC was not demonstrated, consistent with recent CT findings. An infusion catheters placed from the popliteal vein to the left common iliac vein and ultrasound assisted venous thrombo lysis was initiated. IMPRESSION: 1. Extensive occlusive acute thrombus through the posterior tibial, popliteal, femoral, common femoral, left external and common iliac veins. 2. 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 3. Initiation of catheter directed ultrasound assisted thrombolysis of the extensive left lower extremity and pelvic DVT as above. Follow-up venography planned in the morning. Electronically Signed   By: Corlis Leak M.D.   On: 04/10/2016 12:49   Ir Pta Venous Except Dialysis Circuit  04/11/2016  INDICATION: 30 year old with left lower extremity DVT. One day follow-up of catheter directed thrombolytic therapy. EXAM: FOLLOW-UP OF CATHETER DIRECTED THROMBOLYTIC THERAPY - SUBSEQUENT DAY TRANSCATHETER MECHANICAL THROMBECTOMY VENOUS BALLOON ANGIOPLASTY COMPARISON:  None. MEDICATIONS: None. ANESTHESIA/SEDATION: Versed 1 mg IV; Fentanyl 12.5 mcg IV Moderate Sedation Time:  60 minutes The patient was continuously monitored during the procedure by the interventional radiology nurse under my direct supervision. FLUOROSCOPY TIME:  Fluoroscopy Time: 7 minutes 30 seconds (66 mGy). COMPLICATIONS: None immediate. TECHNIQUE: Patient was placed prone on the interventional table. The existing catheter and the back of the left leg were prepped and draped in a sterile fashion. Maximal barrier sterile technique  was utilized including caps, mask, sterile gowns, sterile gloves, sterile drape, hand hygiene and skin antiseptic. Contrast was injected through the 6 French sheath side port. Left lower extremity venogram was performed. The EKOS catheter was removed and a 5 French catheter was advanced into the upper leg and additional venography was performed. A Rosen wire was placed. The iliac veins and upper femoral vein underwent mechanical thrombectomy with the Angiojet device. Follow-up venography was performed. The upper femoral vein and common femoral vein were treated with 6 mm x 40 mm balloon. Follow-up venography was performed. A ew 90 cm infusion catheter was placed. This catheter has 40 cm of infusion length. Tip of  the catheter was placed in the left iliac veins. Catheter directed thrombolysis was continued using tenecteplase. FINDINGS: There was improved flow in the left femoral vein. There was persistent occlusion in the upper femoral vein and the iliac veins. Patent collateral veins in the upper pelvis that appear to be draining through the lumbar venous system. 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 Following thrombectomy and balloon angioplasty, there was flow throughout the left femoral vein with some residual nonocclusive clot. However, there continued to be a large clot burden in the common femoral vein and external iliac vein. Therefore, the infusion catheter was placed. IMPRESSION: Improved venous flow in the left lower extremity and left iliac veins following catheter directed thrombolysis, mechanical thrombectomy and balloon angioplasty. There continues to be a large clot burden in the pelvis. Therefore, will continue catheter directed thrombolysis for at least 1 more day. Electronically Signed   By: Richarda Overlie M.D.   On: 04/11/2016 17:50   Ct Venogram Abd/pel  04/08/2016  CLINICAL DATA:  Patient was diagnosed with deep venous thrombosis today in the left leg. Back pain. EXAM: CT venogram of  the abdomen, pelvis, and lower extremities TECHNIQUE: Axial CT images of the abdomen, pelvis, and bilateral lower extremities from hip to below the knee are obtained with contrast material during venous phase. Additional sagittal and coronal multiplanar reformatted images and maximal intensity projection images are obtained. CONTRAST:  100 mL Isovue 370 COMPARISON:  None. FINDINGS: CT venogram: There is dilatation and filling defect consistent with deep venous thrombosis involving the left tibioperoneal trunk, left popliteal vein, left deep femoral vein, left common femoral vein, left external iliac vein, and left internal iliac vein. Left deep  femoral vein is also involved. The inferior vena cava and iliac veins are somewhat flattened appear patent. Right pelvic and right leg veins are patent. Left leg veins demonstrate infiltration in the fat around the venous structures possibly due to obstruction or possibly indicating infection. Thrombophlebitis is not excluded. Mild diffuse soft tissue edema in the left leg. CT abdomen and pelvis: Lung bases are clear. The liver, spleen, gallbladder, pancreas, adrenal glands, kidneys, abdominal aorta, and retroperitoneal lymph nodes are unremarkable. Stomach, small bowel, and colon are not abnormally distended. No free air or free fluid in the abdomen. Pelvis: Bladder wall is not thickened. No pelvic mass or lymphadenopathy. No free or loculated pelvic fluid collections. Prostate gland is not enlarged. Edema in the soft tissues with superficial S collaterals. Scattered lymph nodes in the pelvis are not abnormally distended. Appendix is normal. No destructive bone lesions. IMPRESSION: Deep venous thrombosis demonstrated involving the left pelvic veins and deep venous system of the left leg, extending from the left external iliac, internal iliac, common femoral, superficial and deep femoral,  popliteal, and tibial peroneal veins. Associated edema and collateral venous structures demonstrated. Infiltration around the deep veins of the left leg may indicate edema or phlebitis. Electronically Signed   By: Burman Nieves M.D.   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 History of right lower extremity venous thrombosis. 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 MEDICATIONS: Lidocaine 1% subcutaneous CONTRAST:  20mL ISOVUE-300 IOPAMIDOL (ISOVUE-300) INJECTION 61% PROCEDURE: The procedure, risks (including but not limited to bleeding, infection, organ damage ), benefits, and alternatives were explained to the patient and family. Questions regarding the procedure were encouraged and answered. The patient understands and consents to the procedure. Patient placed prone on the procedure table. Left leg prepped and draped in usual sterile fashion. Maximal barrier sterile technique was utilized including caps, mask, sterile gowns, sterile gloves, sterile drape, hand hygiene and skin antiseptic. After time-out, moderate sedation was initiated. After the skin was infiltrated with lidocaine, the left posterior tibial vein was accessed under ultrasound guidance with a 21 gauge micropuncture set at the proximal calf level. Venography was performed. The micropuncture dilator was exchanged for a 6 [place] vascular sheath, through which a 5 French angled angiographic catheter was advanced for left lower extremity venography. The catheter was exchanged over a guidewire for the 6 French EKOS infusion device, placed from the popliteal vein to the left common iliac vein. Catheter and sheath were secured externally and catheter directed ultrasound assisted thrombolytic infusion was initiated. Patient transferred to ICU for observation. The patient tolerated the procedure well. COMPLICATIONS: None immediate FINDINGS: Initial left lower extremity venography demonstrated occlusive DVT extending from posterior tibial veins through the popliteal, femoral, common femoral, external iliac, and common iliac veins. Direct  communication to the IVC was not demonstrated, consistent with recent CT findings. An infusion catheters placed from the popliteal vein to the left common iliac vein and ultrasound assisted venous thrombo lysis was initiated. IMPRESSION: 1. Extensive occlusive acute thrombus through the posterior tibial, popliteal, femoral, common femoral, left external and common iliac veins. 2. 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 3. Initiation of catheter directed ultrasound assisted thrombolysis of the extensive left lower extremity and pelvic DVT as above. Follow-up venography planned in the morning. Electronically Signed   By: Corlis Leak M.D.   On: 04/10/2016 12:49   Ir Rande Lawman F/u Eval Art/ven Basil Dess Day (ms)  04/11/2016  INDICATION: 30 year old with left lower extremity DVT. One day follow-up of catheter directed thrombolytic therapy. EXAM: FOLLOW-UP OF CATHETER DIRECTED THROMBOLYTIC THERAPY - SUBSEQUENT DAY TRANSCATHETER MECHANICAL THROMBECTOMY VENOUS BALLOON ANGIOPLASTY COMPARISON:  None. MEDICATIONS: None. ANESTHESIA/SEDATION: Versed 1 mg IV; Fentanyl 12.5 mcg IV Moderate Sedation Time:  60 minutes The patient was continuously monitored during the procedure by the interventional radiology nurse under my direct supervision. FLUOROSCOPY TIME:  Fluoroscopy Time: 7 minutes 30 seconds (66 mGy). COMPLICATIONS: None immediate. TECHNIQUE: Patient was placed prone on the interventional table. The existing catheter  and the back of the left leg were prepped and draped in a sterile fashion. Maximal barrier sterile technique was utilized including caps, mask, sterile gowns, sterile gloves, sterile drape, hand hygiene and skin antiseptic. Contrast was injected through the 6 French sheath side port. Left lower extremity venogram was performed. The EKOS catheter was removed and a 5 French catheter was advanced into the upper leg and additional venography  was performed. A Rosen wire was placed. The iliac veins and upper femoral vein underwent mechanical thrombectomy with the Angiojet device. Follow-up venography was performed. The upper femoral vein and common femoral vein were treated with 6 mm x 40 mm balloon. Follow-up venography was performed. A ew 90 cm infusion catheter was placed. This catheter has 40 cm of infusion length. Tip of the catheter was placed in the left iliac veins. Catheter directed thrombolysis was continued using tenecteplase. FINDINGS: There was improved flow in the left femoral vein. There was persistent occlusion in the upper femoral vein and the iliac veins. Patent collateral veins in the upper pelvis that appear to be draining through the lumbar venous system. 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 Following thrombectomy and balloon angioplasty, there was flow throughout the left femoral vein with some residual nonocclusive clot. However, there continued to be a large clot burden in the common femoral vein and external iliac vein. Therefore, the infusion catheter was placed. IMPRESSION: Improved venous flow in the left lower extremity and left iliac veins following catheter directed thrombolysis, mechanical thrombectomy and balloon angioplasty. There continues to be a large clot burden in the pelvis. Therefore, will continue catheter directed thrombolysis for at least 1 more day. Electronically Signed   By: Richarda Overlie M.D.   On: 04/11/2016 17:50    Lab Data:  CBC:  Recent Labs Lab 04/08/16 2035  04/10/16 0559  04/10/16 2105 04/11/16 0350 04/11/16 1027 04/11/16 1925 04/12/16 0130  WBC 9.6  < > 8.8  < > 11.1* 10.2 9.2 7.3 9.2  NEUTROABS 8.3*  --  5.8  --   --   --   --   --   --   HGB 12.6*  < > 11.6*  < > 11.1* 11.5* 10.6* 9.6* 10.8*  HCT 36.1*  < > 34.8*  < > 33.2* 34.8* 32.7* 29.7* 33.8*  MCV 77.1*  < > 79.5  < > 79.2 78.9 81.1 79.4 79.7  PLT  356  < > 430*  < > 329 343 281 238 282  < > = values in this interval not displayed. Basic Metabolic Panel:  Recent Labs Lab 04/08/16 2035 04/10/16 0559 04/11/16 0350 04/12/16 0611  NA 132* 137 137 139  K 4.1 3.6 3.6 3.8  CL 89* 97* 101 103  CO2 31 25 27 29   GLUCOSE 109* 97 111* 111*  BUN 9 <5* <5* <5*  CREATININE 0.80 0.89 0.84 0.89  CALCIUM 9.3 8.7* 8.0* 8.3*   GFR: Estimated Creatinine Clearance: 138.4 mL/min (by C-G formula based on Cr of 0.89). Liver Function Tests: No results for input(s): AST, ALT, ALKPHOS, BILITOT, PROT, ALBUMIN in the last 168 hours. No results for input(s): LIPASE, AMYLASE in the last 168 hours. No results for input(s): AMMONIA in the last 168 hours. Coagulation Profile:  Recent Labs Lab 04/09/16 1558  INR 1.17   Cardiac Enzymes: No results for input(s): CKTOTAL, CKMB, CKMBINDEX, TROPONINI in the last 168 hours. BNP (last 3 results) No  results for input(s): PROBNP in the last 8760 hours. HbA1C: No results for input(s): HGBA1C in the last 72 hours. CBG: No results for input(s): GLUCAP in the last 168 hours. Lipid Profile: No results for input(s): CHOL, HDL, LDLCALC, TRIG, CHOLHDL, LDLDIRECT in the last 72 hours. Thyroid Function Tests: No results for input(s): TSH, T4TOTAL, FREET4, T3FREE, THYROIDAB in the last 72 hours. Anemia Panel:  Recent Labs  04/09/16 1558  VITAMINB12 857  FERRITIN 1002*  TIBC 190*  IRON 21*  RETICCTPCT 0.9   Urine analysis: No results found for: COLORURINE, APPEARANCEUR, LABSPEC, PHURINE, GLUCOSEU, HGBUR, BILIRUBINUR, KETONESUR, PROTEINUR, UROBILINOGEN, NITRITE, Hurshel Party M.D. [facility] 04/12/2016, 12:16 PM  Pager: [phone] Between 7am to 7pm - call Pager - [phone]  After 7pm go to [email or web address] - password [email or web address]  Call night coverage person covering after 7pm

## 2016-04-12 NOTE — Procedures (Signed)
Successful completion of left lower extremity venous lysis with restoration of antegrade flow into multiple hypertrophied paraspinal venous collaterals. EBL: Minimal No immediate post procedural complications.  Katherina Right, MD Pager #: [phone]

## 2016-04-12 NOTE — Sedation Documentation (Signed)
Heparin drip turned off per Dr. Grace Isaac.

## 2016-04-12 NOTE — Progress Notes (Signed)
ANTICOAGULATION CONSULT NOTE Pharmacy Consult for Heparin Indication: DVT  Allergies  Allergen Reactions  . Ibuprofen     Messes stomach up  . Tylenol [Acetaminophen]     Messes stomach up    Patient Measurements: Height: 6\' 1"  (185.4 cm) Weight: 187 lb (84.823 kg) IBW/kg (Calculated) : 79.9 Heparin Dosing Weight: 84 kg  Vital Signs: Temp: 98.6 F (37 C) (05/19 0741) Temp Source: Oral (05/19 0741) BP: 127/63 mmHg (05/19 0800) Pulse Rate: 86 (05/19 0900)  Labs:  Recent Labs  04/09/16 1558  04/10/16 0559  04/11/16 0350 04/11/16 1027 04/11/16 1924 04/11/16 1925 04/12/16 0130 04/12/16 0611  HGB  --   --  11.6*  < > 11.5* 10.6*  --  9.6* 10.8*  --   HCT 33.0*  --  34.8*  < > 34.8* 32.7*  --  29.7* 33.8*  --   PLT  --   --  430*  < > 343 281  --  238 282  --   APTT 34  --   --   --   --   --   --   --   --   --   LABPROT 15.1  --   --   --   --   --   --   --   --   --   INR 1.17  --   --   --   --   --   --   --   --   --   HEPARINUNFRC  --   < > 0.25*  < > 0.49  --  0.38  --  0.11* 0.61  CREATININE  --   --  0.89  --  0.84  --   --   --   --  0.89  < > = values in this interval not displayed.  Estimated Creatinine Clearance: 138.4 mL/min (by C-G formula based on Cr of 0.89).  Assessment: 30 yo male with extensive DVT in L lower extremity. Pt completed 24 hours of catheter-directed alteplase yesterday. IR noted patient had still had a large amount of clot left so decision was made to extend catheter-directed thrombolysis for another 24 hours. 2nd round of TNK should end today at 1800. Heparin level is slightly supratherapeutic (goal of 0.2-0.5). CBC stable.    Goal of Therapy:  Heparin level 0.3-0.7 units/ml Monitor platelets by anticoagulation protocol: Yes   Plan:  -Reduce heparin to 2450 units/hr -Daily HL and CBC -Check level this afternoon -F/u plan after EKOS for PO anticoagulation     Agapito Games, PharmD, BCPS Clinical Pharmacist 04/12/2016 9:15  AM

## 2016-04-12 NOTE — Progress Notes (Signed)
ANTICOAGULATION CONSULT NOTE Pharmacy Consult for Heparin Indication: DVT  Allergies  Allergen Reactions  . Ibuprofen     Messes stomach up  . Tylenol [Acetaminophen]     Messes stomach up    Patient Measurements: Height:  (185.4 cm) Weight: 187 lb (84.823 kg) IBW/kg (Calculated) : 79.9 Heparin Dosing Weight: 84 kg  Vital Signs: Temp: 99.1 F (37.3 C) (05/19 1147) Temp Source: Oral (05/19 1147) BP: 116/69 mmHg (05/19 1500) Pulse Rate: 86 (05/19 1500)  Labs:  Recent Labs  04/10/16 0559  04/11/16 0350  04/11/16 1925 04/12/16 0130 04/12/16 0611 04/12/16 1438  HGB 11.6*  < > 11.5*  < > 9.6* 10.8*  --  10.0*  HCT 34.8*  < > 34.8*  < > 29.7* 33.8*  --  31.6*  PLT 430*  < > 343  < > 238 282  --  284  HEPARINUNFRC 0.25*  < > 0.49  < >  --  0.11* 0.61 0.52  CREATININE 0.89  --  0.84  --   --   --  0.89  --   < > = values in this interval not displayed.  Estimated Creatinine Clearance: 138.4 mL/min (by C-G formula based on Cr of 0.89).  Assessment: 30 yo Orozco with extensive DVT- undergoing EKOS through 5/18 at 1100 (alteplase completed).   5/19 PM: Had 5/18 mechanical thrombectomy of DVT with Angiojet to persistent clot in upper femoral and iliac veins. Baloon angioplasty of upper femoral vein and CFV.Marland Kitchen Switched to a traditional infusion catheter and started on TNK to end 5/19 @ 1800 (goal will be increased back to 0.3-0.5). Heparin level 0.52 slightly above goal, but TNK will stop in 2 hrs and goal will increase.   Goal of Therapy:  HL 0.2-0.5 while on thrombolytic, 0.3-0.7 when TNK d/c'd Monitor platelets by anticoagulation protocol: Yes   Plan:  Continue IV heparin at 2450 units/hr. Tenecteplase to end at 1800 Heparin level and CBC in AM.   Artina Minella S. Merilynn Finland, PharmD, BCPS Clinical Staff Pharmacist Pager [phone]  04/12/2016 4:05 PM

## 2016-04-12 NOTE — Progress Notes (Signed)
ANTICOAGULATION CONSULT NOTE - Follow Up Consult  Pharmacy Consult for heparin Indication: DVT on EKOS  Labs:  Recent Labs  04/09/16 1558  04/10/16 0559  04/11/16 0350 04/11/16 1027 04/11/16 1924 04/11/16 1925 04/12/16 0130  HGB  --   --  11.6*  < > 11.5* 10.6*  --  9.6* 10.8*  HCT 33.0*  --  34.8*  < > 34.8* 32.7*  --  29.7* 33.8*  PLT  --   --  430*  < > 343 281  --  238 282  APTT 34  --   --   --   --   --   --   --   --   LABPROT 15.1  --   --   --   --   --   --   --   --   INR 1.17  --   --   --   --   --   --   --   --   HEPARINUNFRC  --   < > 0.25*  < > 0.49  --  0.38  --  0.11*  CREATININE  --   --  0.89  --  0.84  --   --   --   --   < > = values in this interval not displayed.   Assessment: 30yo male now subtherapeutic on heparin after several labs at goal, RN says no infusion interruptions overnight.  Goal of Therapy:  Heparin level 0.2-0.5 units/ml   Plan:  Will increase heparin gtt by 10% to 2600 units/hr and check level w/ next lab draw.  Vernard Gambles, PharmD, BCPS  04/12/2016,2:48 AM

## 2016-04-13 DIAGNOSIS — E876 Hypokalemia: Secondary | ICD-10-CM

## 2016-04-13 DIAGNOSIS — I82401 Acute embolism and thrombosis of unspecified deep veins of right lower extremity: Secondary | ICD-10-CM

## 2016-04-13 DIAGNOSIS — K5000 Crohn's disease of small intestine without complications: Secondary | ICD-10-CM

## 2016-04-13 DIAGNOSIS — F250 Schizoaffective disorder, bipolar type: Secondary | ICD-10-CM

## 2016-04-13 LAB — FIBRINOGEN
FIBRINOGEN: 338 mg/dL (ref 204–475)
FIBRINOGEN: 425 mg/dL (ref 204–475)
Fibrinogen: 447 mg/dL (ref 204–475)

## 2016-04-13 LAB — CBC
HEMATOCRIT: 34.9 % — AB (ref 39.0–52.0)
HEMATOCRIT: 34.9 % — AB (ref 39.0–52.0)
HEMOGLOBIN: 11 g/dL — AB (ref 13.0–17.0)
Hemoglobin: 10.9 g/dL — ABNORMAL LOW (ref 13.0–17.0)
MCH: 25.1 pg — ABNORMAL LOW (ref 26.0–34.0)
MCH: 25 pg — AB (ref 26.0–34.0)
MCHC: 31.2 g/dL (ref 30.0–36.0)
MCHC: 31.5 g/dL (ref 30.0–36.0)
MCV: 80.2 fL (ref 78.0–100.0)
MCV: 79.3 fL (ref 78.0–100.0)
PLATELETS: 253 10*3/uL (ref 150–400)
Platelets: 343 10*3/uL (ref 150–400)
RBC: 4.35 MIL/uL (ref 4.22–5.81)
RBC: 4.4 MIL/uL (ref 4.22–5.81)
RDW: 15.6 % — AB (ref 11.5–15.5)
RDW: 14.6 % (ref 11.5–15.5)
WBC: 7.9 10*3/uL (ref 4.0–10.5)
WBC: 7.3 10*3/uL (ref 4.0–10.5)

## 2016-04-13 LAB — BASIC METABOLIC PANEL
ANION GAP: 8 (ref 5–15)
CO2: 28 mmol/L (ref 22–32)
Calcium: 8.2 mg/dL — ABNORMAL LOW (ref 8.9–10.3)
Chloride: 104 mmol/L (ref 101–111)
Creatinine, Ser: 0.83 mg/dL (ref 0.61–1.24)
GFR calc Af Amer: >60 mL/min (ref >60)
GLUCOSE: 94 mg/dL (ref 65–99)
POTASSIUM: 3.2 mmol/L — AB (ref 3.5–5.1)
Sodium: 140 mmol/L (ref 135–145)

## 2016-04-13 LAB — HEPARIN LEVEL (UNFRACTIONATED)
HEPARIN UNFRACTIONATED: 0.76 [IU]/mL — AB (ref 0.30–0.70)
Heparin Unfractionated: 0.86 IU/mL — ABNORMAL HIGH (ref 0.30–0.70)

## 2016-04-13 MED ORDER — HEPARIN (PORCINE) IN NACL 100-0.45 UNIT/ML-% IJ SOLN
2300.0000 [IU]/h | INTRAMUSCULAR | Status: DC
  Filled 2016-04-13: qty 250

## 2016-04-13 MED ORDER — BACLOFEN 20 MG PO TABS
20.0000 mg | ORAL_TABLET | Freq: Three times a day (TID) | ORAL | Status: DC | PRN
  Filled 2016-04-13: qty 1

## 2016-04-13 MED ORDER — DIAZEPAM 5 MG PO TABS: 5.0000 mg | ORAL_TABLET | Freq: Four times a day (QID) | ORAL | Status: DC | PRN

## 2016-04-13 MED ORDER — POTASSIUM CHLORIDE CRYS ER 20 MEQ PO TBCR
40.0000 meq | EXTENDED_RELEASE_TABLET | ORAL | Status: AC
  Administered 2016-04-13 (×2): 40 meq via ORAL
  Filled 2016-04-13 (×2): qty 2

## 2016-04-13 MED ORDER — FAMOTIDINE 20 MG PO TABS
20.0000 mg | ORAL_TABLET | Freq: Two times a day (BID) | ORAL | Status: DC
  Administered 2016-04-13 – 2016-04-14 (×2): 20 mg via ORAL
  Filled 2016-04-13 (×2): qty 1

## 2016-04-13 NOTE — Progress Notes (Signed)
ANTICOAGULATION CONSULT NOTE - Follow Up Consult  Pharmacy Consult for Heparin Indication: DVT  Allergies  Allergen Reactions  . Ibuprofen     Messes stomach up  . Tylenol [Acetaminophen]     Messes stomach up    Patient Measurements: Height: 6\' 1"  (185.4 cm) Weight: 187 lb (84.823 kg) IBW/kg (Calculated) : 79.9 Heparin Dosing Weight: 84 kg  Vital Signs: Temp: 98.7 F (37.1 C) (05/20 1336) Temp Source: Oral (05/20 1336) BP: 120/75 mmHg (05/20 1336) Pulse Rate: 75 (05/20 1336)  Labs:  Recent Labs  04/11/16 0350  04/12/16 0611 04/12/16 1438 04/13/16 0315 04/13/16 0413 04/13/16 0910 04/13/16 1805  HGB 11.5*  < >  --  10.0*  --  10.9* 11.0*  --   HCT 34.8*  < >  --  31.6*  --  34.9* 34.9*  --   PLT 343  < >  --  284  --  253 343  --   HEPARINUNFRC 0.49  < > 0.61 0.52 <0.10*  --  0.76* 0.86*  CREATININE 0.84  --  0.89  --   --   --  0.83  --   < > = values in this interval not displayed.  Estimated Creatinine Clearance: 148.4 mL/min (by C-G formula based on Cr of 0.83).  Medications: Heparin @ 2500 units/hr  Assessment: 30 yo male with extensive DVT- s/p EKOS (alteplase ended 5/18 1100). Also on 5/18 s/p mechanical thrombectomy of DVT with angiojet to persistent clot in upper femoral and iliac veins. Underwent balloon angioplasty of upper femoral vein and CFV, switched to a traditional infusion catheter and started on TNK - ended 5/19 1800.   Heparin level remains elevated at 0.86. No bleeding noted.   Goal of Therapy:  Heparin level 0.3-0.7 units/ml  Monitor platelets by anticoagulation protocol: Yes   Plan:  1) Decrease heparin to 2300 units/hr 2) Check an 8 hour heparin level  Lysle Pearl, PharmD, BCPS Pager # [phone] 04/13/2016 7:40 PM

## 2016-04-13 NOTE — Progress Notes (Signed)
Referring Physician(s): Dr Thad Ranger  Supervising Physician: Simonne Come  Patient Status: In-pt  Chief Complaint:  LLE DVT Successful completion of left lower extremity venous lysis with restoration of antegrade flow into multiple hypertrophied paraspinal venous collaterals. EBL: Minimal No immediate post procedural complications.  Subjective:  Feels better Less pain; less pressure Swelling diminished Ambulating in room  Allergies: Ibuprofen and Tylenol  Medications: Prior to Admission medications   Medication Sig Start Date End Date Taking? Authorizing Provider  BACLOFEN PO Take 20 mg by mouth every 8 (eight) hours as needed (muscle spasms).    Yes Historical Provider, MD  benztropine (COGENTIN) 0.5 MG tablet Take 0.5 mg by mouth 2 (two) times daily.   Yes Historical Provider, MD  clopidogrel (PLAVIX) 75 MG tablet Take 75 mg by mouth daily.   Yes Historical Provider, MD  DIAZEPAM PO Take 5 mg by mouth 4 (four) times daily.    Yes Historical Provider, MD  FAMOTIDINE PO Take 20 mg by mouth 2 (two) times daily.    Yes Historical Provider, MD  fluPHENAZine (PROLIXIN) 5 MG tablet Take 5 mg by mouth 2 (two) times daily.   Yes Historical Provider, MD  mesalamine (LIALDA) 1.2 g EC tablet Take 4.8 g by mouth daily with breakfast.    Yes Historical Provider, MD  mirtazapine (REMERON) 30 MG tablet Take 30 mg by mouth at bedtime.   Yes Historical Provider, MD  Oxcarbazepine (TRILEPTAL) 300 MG tablet Take 300 mg by mouth 2 (two) times daily.   Yes Historical Provider, MD  OXYCODONE HCL PO Take by mouth.   Yes Historical Provider, MD  Paliperidone Palmitate (INVEGA SUSTENNA IM) Inject 156 mg into the muscle every 30 (thirty) days.    Yes Historical Provider, MD  PREDNISONE PO Take 10 mg by mouth See admin instructions. On taper dose pack.   Yes Historical Provider, MD  Vilazodone HCl (VIIBRYD) 20 MG TABS Take 20 mg by mouth daily.   Yes Historical Provider, MD     Vital  Signs: BP 121/72 mmHg  Pulse 76  Temp(Src) 98.4 F (36.9 C) (Oral)  Resp 14  Ht 6\' 1"  (1.854 m)  Wt 187 lb (84.823 kg)  BMI 24.68 kg/m2  SpO2 98%  Physical Exam  Constitutional: He is oriented to person, place, and time.  Pulmonary/Chest: Effort normal.  Abdominal: Soft.  Neurological: He is alert and oriented to person, place, and time.  Skin: Skin is warm and dry.  Left posterior knee site is NT no bleeding No hematoma Clean and dry Leg minimally swollen today NO pain to palpate  Left foot good sensation; good strength FROM 2+ pulses   Nursing note and vitals reviewed.   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 History of right lower extremity venous thrombosis. 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 MEDICATIONS: Lidocaine 1% subcutaneous CONTRAST:  20mL ISOVUE-300 IOPAMIDOL (ISOVUE-300) INJECTION 61% PROCEDURE: The procedure, risks (including but not limited to bleeding, infection, organ damage ), benefits, and alternatives were explained to the  patient and family. Questions regarding the procedure were encouraged and answered. The patient understands and consents to the procedure. Patient placed prone on the procedure table. Left leg prepped and draped in usual sterile fashion. Maximal barrier sterile technique was utilized including caps, mask, sterile gowns, sterile gloves, sterile drape,  hand hygiene and skin antiseptic. After time-out, moderate sedation was initiated. After the skin was infiltrated with lidocaine, the left posterior tibial vein was accessed under ultrasound guidance with a 21 gauge micropuncture set at the proximal calf level. Venography was performed. The micropuncture dilator was exchanged for a 6 [place] vascular sheath, through which a 5 French angled angiographic catheter was advanced for left lower extremity venography. The catheter was exchanged over a guidewire for the 6 French EKOS infusion device, placed from the popliteal vein to the left common iliac vein. Catheter and sheath were secured externally and catheter directed ultrasound assisted thrombolytic infusion was initiated. Patient transferred to ICU for observation. The patient tolerated the procedure well. COMPLICATIONS: None immediate FINDINGS: Initial left lower extremity venography demonstrated occlusive DVT extending from posterior tibial veins through the popliteal, femoral, common femoral, external iliac, and common iliac veins. Direct communication to the IVC was not demonstrated, consistent with recent CT findings. An infusion catheters placed from the popliteal vein to the left common iliac vein and ultrasound assisted venous thrombo lysis was initiated. IMPRESSION: 1. Extensive occlusive acute thrombus through the posterior tibial, popliteal, femoral, common femoral, left external and common iliac veins. 2. 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 3. Initiation of catheter directed ultrasound assisted thrombolysis of the extensive left lower extremity and pelvic DVT as above. Follow-up venography planned in the morning. Electronically Signed   By: Corlis Leak M.D.   On: 04/10/2016 12:49   Ir Thrombect Veno Mech Mod Sed  04/11/2016  INDICATION: 30 year old with left lower extremity DVT. One day follow-up of catheter directed  thrombolytic therapy. EXAM: FOLLOW-UP OF CATHETER DIRECTED THROMBOLYTIC THERAPY - SUBSEQUENT DAY TRANSCATHETER MECHANICAL THROMBECTOMY VENOUS BALLOON ANGIOPLASTY COMPARISON:  None. MEDICATIONS: None. ANESTHESIA/SEDATION: Versed 1 mg IV; Fentanyl 12.5 mcg IV Moderate Sedation Time:  60 minutes The patient was continuously monitored during the procedure by the interventional radiology nurse under my direct supervision. FLUOROSCOPY TIME:  Fluoroscopy Time: 7 minutes 30 seconds (66 mGy). COMPLICATIONS: None immediate. TECHNIQUE: Patient was placed prone on the interventional table. The existing catheter and the back of the left leg were prepped and draped in a sterile fashion. Maximal barrier sterile technique was utilized including caps, mask, sterile gowns, sterile gloves, sterile drape, hand hygiene and skin antiseptic. Contrast was injected through the 6 French sheath side port. Left lower extremity venogram was performed. The EKOS catheter was removed and a 5 French catheter was advanced into the upper leg and additional venography was performed. A Rosen wire was placed. The iliac veins and upper femoral vein underwent mechanical thrombectomy with the Angiojet device. Follow-up venography was performed. The upper femoral vein and common femoral vein were treated with 6 mm x 40 mm balloon. Follow-up venography was performed. A ew 90 cm infusion catheter was placed. This catheter has 40 cm of infusion length. Tip of the catheter was placed in the left iliac veins. Catheter directed thrombolysis was continued using tenecteplase. FINDINGS: There was improved flow in the left femoral vein. There was persistent occlusion in the upper femoral vein and the iliac veins. Patent collateral veins in the upper pelvis that  appear to be draining through the lumbar venous system. 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 Following thrombectomy and balloon  angioplasty, there was flow throughout the left femoral vein with some residual nonocclusive clot. However, there continued to be a large clot burden in the common femoral vein and external iliac vein. Therefore, the infusion catheter was placed. IMPRESSION: Improved venous flow in the left lower extremity and left iliac veins following catheter directed thrombolysis, mechanical thrombectomy and balloon angioplasty. There continues to be a large clot burden in the pelvis. Therefore, will continue catheter directed thrombolysis for at least 1 more day. Electronically Signed   By: Richarda Overlie M.D.   On: 04/11/2016 17:50   Ir Thrombect Veno Mech Rept Mod Sed  04/12/2016  INDICATION: History of multiple prior femoral venous catheterizations as a premature neonate. 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 Patient with development of new extensive left lower extremity venous thrombosis as such, patient underwent initiation of catheter directed thrombolysis performed 04/10/2016. Patient returns in interventional radiology department for venous lysis completion evaluation and intervention. EXAM: 1. IR THROMB F/U EVAL ART/VEN FINAL DAY; 2. FLUOROSCOPIC GUIDED THROMBECTOMY MECHANICAL VENOUS COMPARISON:  CT of the abdomen pelvis - 04/08/2016; initiation of left lower extremity catheter directed thrombolysis - 04/10/2016; subsequent catheter directed thrombolysis - 04/11/2016 MEDICATIONS: None. ANESTHESIA/SEDATION: Moderate (conscious) sedation was employed during this procedure. A total of Fentanyl 100 mcg was administered intravenously. Moderate Sedation Time: 20 minutes. The patient's level of consciousness and vital signs were monitored continuously by radiology nursing throughout the procedure under my direct supervision. FLUOROSCOPY TIME:  Fluoroscopy Time: 3 minutes 48 seconds (129 mGy). COMPLICATIONS: None immediate. TECHNIQUE: Informed written consent  was obtained from the patient after a thorough discussion of the procedural risks, benefits and alternatives. All questions were addressed. Maximal Sterile Barrier Technique was utilized including caps, mask, sterile gowns, sterile gloves, sterile drape, hand hygiene and skin antiseptic. A timeout was performed prior to the initiation of the procedure. The external portion of the existing left popliteal approach venous catheter as well as external portion of the infusion catheter were prepped and draped in usual sterile fashion. The wire of the infusion catheter was removed and a central left lower extremity venogram was performed. A thigh venogram was performed through the side arm of the popliteal vascular sheath. Over a Rosen wire, the infusion catheter was exchanged for a vertebral catheter and a pelvic venogram was performed. Next, 2 rounds of mechanical thrombectomy was performed throughout the left external common and superficial femoral veins with the use of an Angiojet device. Completion venogram images were obtained in the procedure was terminated. All wires catheters and sheaths were removed from the patient. Hemostasis was achieved at the left popliteal access site with manual compression. A dressing was placed. The patient tolerated the procedure well without immediate postprocedural complication. FINDINGS: Initial left lower extremity venogram demonstrates restored patency with antegrade flow throughout the left femoral vein. A moderate to large amount of nonocclusive thrombus within the left external iliac and common femoral vein was treated with 2 rounds of mechanical thrombectomy with the use of an Angiojet device. Completion pelvic venogram demonstrates restored antegrade flow through the pelvis with filling of multiple hypertrophied paraspinal venous collaterals with eventual opacification of the perirenal IVC at the level of the L3 vertebral body. A definitive infrarenal IVC was not identified.  IMPRESSION:  1. Technically successful 2 day catheter directed thrombolysis of extensive left lower extremity DVT with restoration of antegrade flow throughout the left lower extremity to the level of hypertrophied paraspinal venous collaterals. 2. The perirenal IVC identified at the level of the L3 vertebral body. 3. An infrarenal IVC is not identified compatible with the findings suspected on preceding abdominal CT. PLAN: - The patient will be maintained on IV heparin until he is a transitioned to an appropriate outpatient anticoagulant as per the primary clinical team. Unfortunately, given the extent of dizziness disease, the patient will likely be maintained on anti coagulation indefinitely. - Recommend patient wear bilateral thigh high venous compression hoses at least for the duration of the summer. - Patient will be seen in follow-up consultation by initiating interventional radiologist, Dr. Deanne Coffer in approximately 3-4 weeks with repeat bilateral lower extremity venous Doppler ultrasound. Electronically Signed   By: Simonne Come M.D.   On: 04/12/2016 17:52   Ir US Guide Vasc Access Left  04/10/2016  CLINICAL DATA:  History of multiple femoral venous catheterization such as a premature neonate. History of right lower extremity venous thrombosis. 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 MEDICATIONS: Lidocaine 1% subcutaneous CONTRAST:  20mL ISOVUE-300 IOPAMIDOL (ISOVUE-300) INJECTION 61% PROCEDURE: The procedure,  risks (including but not limited to bleeding, infection, organ damage ), benefits, and alternatives were explained to the patient and family. Questions regarding the procedure were encouraged and answered. The patient understands and consents to the procedure. Patient placed prone on the procedure table. Left leg prepped and draped in usual sterile fashion. Maximal barrier sterile technique was utilized including caps, mask, sterile gowns, sterile gloves, sterile drape, hand hygiene and skin antiseptic. After time-out, moderate sedation was initiated. After the skin was infiltrated with lidocaine, the left posterior tibial vein was accessed under ultrasound guidance with a 21 gauge micropuncture set at the proximal calf level. Venography was performed. The micropuncture dilator was exchanged for a 6 [place] vascular sheath, through which a 5 French angled angiographic catheter was advanced for left lower extremity venography. The catheter was exchanged over a guidewire for the 6 French EKOS infusion device, placed from the popliteal vein to the left common iliac vein. Catheter and sheath were secured externally and catheter directed ultrasound assisted thrombolytic infusion was initiated. Patient transferred to ICU for observation. The patient tolerated the procedure well. COMPLICATIONS: None immediate FINDINGS: Initial left lower extremity venography demonstrated occlusive DVT extending from posterior tibial veins through the popliteal, femoral, common femoral, external iliac, and common iliac veins. Direct communication to the IVC was not demonstrated, consistent with recent CT findings. An infusion catheters placed from the popliteal vein to the left common iliac vein and ultrasound assisted venous thrombo lysis was initiated. IMPRESSION: 1. Extensive occlusive acute thrombus through the posterior tibial, popliteal, femoral, common femoral, left external and common iliac veins. 2. 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 3. Initiation of catheter directed ultrasound assisted thrombolysis of the extensive left lower extremity and pelvic DVT as above. Follow-up venography planned in the  morning. Electronically Signed   By: Corlis Leak M.D.   On: 04/10/2016 12:49   Ir Pta Venous Except Dialysis Circuit  04/11/2016  INDICATION: 30 year old with left lower extremity DVT. One day follow-up of catheter directed thrombolytic therapy. EXAM: FOLLOW-UP OF CATHETER DIRECTED THROMBOLYTIC THERAPY - SUBSEQUENT DAY TRANSCATHETER MECHANICAL THROMBECTOMY VENOUS BALLOON ANGIOPLASTY COMPARISON:  None. MEDICATIONS: None. ANESTHESIA/SEDATION: Versed 1 mg IV; Fentanyl 12.5 mcg IV Moderate Sedation Time:  60 minutes The patient was continuously monitored during the procedure by the interventional radiology nurse under my direct supervision. FLUOROSCOPY TIME:  Fluoroscopy Time: 7 minutes 30 seconds (66 mGy). COMPLICATIONS: None immediate. TECHNIQUE: Patient was placed prone on the interventional table. The existing catheter and the back of the left leg were prepped and draped in a sterile fashion. Maximal barrier sterile technique was utilized including caps, mask, sterile gowns, sterile gloves, sterile drape, hand hygiene and skin antiseptic. Contrast was injected through the 6 French sheath side port. Left lower extremity venogram was performed. The EKOS catheter was removed and a 5 French catheter was advanced into the upper leg and additional venography was performed. A Rosen wire was placed. The iliac veins and upper femoral vein underwent mechanical thrombectomy with the Angiojet device. Follow-up venography was performed. The upper femoral vein and common femoral vein were treated with 6 mm x 40 mm balloon. Follow-up venography was performed. A ew 90 cm infusion catheter was placed. This catheter has 40 cm of infusion length. Tip of the catheter was placed  in the left iliac veins. Catheter directed thrombolysis was continued using tenecteplase. FINDINGS: There was improved flow in the left femoral vein. There was persistent occlusion in the upper femoral vein and the iliac veins. Patent collateral veins in the upper pelvis that appear to be draining through the lumbar venous system. 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 Following thrombectomy and balloon angioplasty, there was flow throughout the left femoral vein with some residual nonocclusive clot. However, there continued to be a large clot burden in the common femoral vein and external iliac vein. Therefore, the infusion catheter was placed. IMPRESSION: Improved venous flow in the left lower extremity and left iliac veins following catheter directed thrombolysis, mechanical thrombectomy and balloon angioplasty. There continues to be a large clot burden in the pelvis. Therefore, will continue catheter directed thrombolysis for at least 1 more day. Electronically Signed   By: Richarda Overlie M.D.   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 History of right lower extremity venous thrombosis. 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 MEDICATIONS: Lidocaine 1% subcutaneous CONTRAST:  20mL ISOVUE-300 IOPAMIDOL (ISOVUE-300)  INJECTION 61% PROCEDURE: The procedure, risks (including but not limited to bleeding, infection, organ damage ), benefits, and alternatives were explained to the patient and family. Questions regarding the procedure were encouraged and answered. The patient understands and consents to the procedure. Patient placed prone on the procedure table. Left leg prepped  and draped in usual sterile fashion. Maximal barrier sterile technique was utilized including caps, mask, sterile gowns, sterile gloves, sterile drape, hand hygiene and skin antiseptic. After time-out, moderate sedation was initiated. After the skin was infiltrated with lidocaine, the left posterior tibial vein was accessed under ultrasound guidance with a 21 gauge micropuncture set at the proximal calf level. Venography was performed. The micropuncture dilator was exchanged for a 6 [place] vascular sheath, through which a 5 French angled angiographic catheter was advanced for left lower extremity venography. The catheter was exchanged over a guidewire for the 6 French EKOS infusion device, placed from the popliteal vein to the left common iliac vein. Catheter and sheath were secured externally and catheter directed ultrasound assisted thrombolytic infusion was initiated. Patient transferred to ICU for observation. The patient tolerated the procedure well. COMPLICATIONS: None immediate FINDINGS: Initial left lower extremity venography demonstrated occlusive DVT extending from posterior tibial veins through the popliteal, femoral, common femoral, external iliac, and common iliac veins. Direct communication to the IVC was not demonstrated, consistent with recent CT findings. An infusion catheters placed from the popliteal vein to the left common iliac vein and ultrasound assisted venous thrombo lysis was initiated. IMPRESSION: 1. Extensive occlusive acute thrombus through the posterior tibial, popliteal, femoral, common femoral, left external and common iliac  veins. 2. 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 3. Initiation of catheter directed ultrasound assisted thrombolysis of the extensive left lower extremity and pelvic DVT as above. Follow-up venography planned in the morning. Electronically Signed   By: Corlis Leak M.D.   On: 04/10/2016 12:49   Ir Rande Lawman F/u Eval Art/ven Basil Dess Day (ms)  04/11/2016  INDICATION: 30 year old with left lower extremity DVT. One day follow-up of catheter directed thrombolytic therapy. EXAM: FOLLOW-UP OF CATHETER DIRECTED THROMBOLYTIC THERAPY - SUBSEQUENT DAY TRANSCATHETER MECHANICAL THROMBECTOMY VENOUS BALLOON ANGIOPLASTY COMPARISON:  None. MEDICATIONS: None. ANESTHESIA/SEDATION: Versed 1 mg IV; Fentanyl 12.5 mcg IV Moderate Sedation Time:  60 minutes The patient was continuously monitored during the procedure by the interventional radiology nurse under my direct supervision. FLUOROSCOPY TIME:  Fluoroscopy Time: 7 minutes 30 seconds (66 mGy). COMPLICATIONS: None immediate. TECHNIQUE: Patient was placed prone on the interventional table. The existing catheter and the back of the left leg were prepped and draped in a sterile fashion. Maximal barrier sterile technique was utilized including caps, mask, sterile gowns, sterile gloves, sterile drape, hand hygiene and skin antiseptic. Contrast was injected through the 6 French sheath side port. Left lower extremity venogram was performed. The EKOS catheter was removed and a 5 French catheter was advanced into the upper leg and additional venography was performed. A Rosen wire was placed. The iliac veins and upper femoral vein underwent mechanical thrombectomy with the Angiojet device. Follow-up venography was performed. The upper femoral vein and common femoral vein were treated with 6 mm x 40 mm balloon. Follow-up venography was performed. A ew 90 cm infusion catheter was placed. This catheter has 40 cm of infusion  length. Tip of the catheter was placed in the left iliac veins. Catheter directed thrombolysis was continued using tenecteplase. FINDINGS: There was improved flow in the left femoral vein. There was persistent occlusion in the upper femoral vein and the iliac veins. Patent collateral veins in the upper pelvis that appear to be draining through the lumbar venous system. 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 Following thrombectomy and balloon angioplasty, there was flow throughout the left femoral vein with some residual nonocclusive clot. However, there continued to be a large clot burden in the common femoral vein and external iliac vein. Therefore, the infusion catheter was placed. IMPRESSION: Improved venous flow in the left lower extremity and left iliac veins following catheter directed thrombolysis, mechanical thrombectomy and balloon angioplasty. There continues to be a large clot burden in the pelvis. Therefore, will continue catheter directed thrombolysis for at least 1 more day. Electronically Signed   By: Richarda Overlie M.D.   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 Patient with development of new extensive left lower extremity venous thrombosis as such, patient underwent initiation of catheter directed thrombolysis performed 04/10/2016. Patient returns in interventional radiology department for venous lysis completion evaluation and intervention. EXAM: 1. IR THROMB F/U EVAL ART/VEN FINAL DAY; 2. FLUOROSCOPIC GUIDED THROMBECTOMY MECHANICAL VENOUS COMPARISON:  CT of the abdomen pelvis - 04/08/2016; initiation of left lower extremity catheter directed thrombolysis - 04/10/2016; subsequent catheter  directed thrombolysis - 04/11/2016 MEDICATIONS: None. ANESTHESIA/SEDATION: Moderate (conscious) sedation was employed during this procedure. A total of Fentanyl 100 mcg was administered intravenously. Moderate Sedation Time: 20 minutes. The patient's level of consciousness and vital signs were monitored continuously by radiology nursing throughout the procedure under my direct supervision. FLUOROSCOPY TIME:  Fluoroscopy Time: 3 minutes 48 seconds (129 mGy). COMPLICATIONS: None immediate. TECHNIQUE: Informed written consent was obtained from the patient after a thorough discussion of the procedural risks, benefits and alternatives. All questions were addressed. Maximal Sterile Barrier Technique was utilized including caps, mask, sterile gowns, sterile gloves, sterile drape, hand hygiene and skin antiseptic. A timeout was performed prior to the initiation of the procedure. The external portion of the existing left popliteal approach venous catheter as well as external portion of the infusion catheter were prepped and draped in usual sterile fashion. The wire of the infusion catheter was removed and a central left lower extremity venogram was performed. A thigh venogram was performed through the side arm of the popliteal vascular sheath. Over a Rosen wire, the infusion catheter was exchanged for a vertebral catheter and a pelvic venogram was performed. Next, 2 rounds of mechanical thrombectomy was performed throughout the left external common and superficial femoral veins with the use of an Angiojet device. Completion venogram images were obtained in the procedure was terminated. All wires catheters and sheaths were removed from the patient. Hemostasis was achieved at the left popliteal access site with manual compression. A dressing was placed. The patient tolerated the procedure well without immediate postprocedural complication. FINDINGS: Initial left lower extremity venogram demonstrates restored patency with  antegrade flow throughout the left femoral vein. A moderate to large amount of nonocclusive thrombus within the left external iliac and common femoral vein was treated with 2 rounds of mechanical thrombectomy with the use of an Angiojet device. Completion pelvic venogram demonstrates restored antegrade flow through the pelvis with filling of multiple hypertrophied paraspinal venous collaterals with eventual opacification of the perirenal IVC at the level of the L3 vertebral body. A definitive infrarenal IVC was not identified. IMPRESSION: 1. Technically successful 2 day catheter directed thrombolysis of extensive left lower extremity DVT with restoration of antegrade flow throughout the left lower extremity to the level  of hypertrophied paraspinal venous collaterals. 2. The perirenal IVC identified at the level of the L3 vertebral body. 3. An infrarenal IVC is not identified compatible with the findings suspected on preceding abdominal CT. PLAN: - The patient will be maintained on IV heparin until he is a transitioned to an appropriate outpatient anticoagulant as per the primary clinical team. Unfortunately, given the extent of dizziness disease, the patient will likely be maintained on anti coagulation indefinitely. - Recommend patient wear bilateral thigh high venous compression hoses at least for the duration of the summer. - Patient will be seen in follow-up consultation by initiating interventional radiologist, Dr. Deanne Coffer in approximately 3-4 weeks with repeat bilateral lower extremity venous Doppler ultrasound. Electronically Signed   By: Simonne Come M.D.   On: 04/12/2016 17:52    Labs:  CBC:  Recent Labs  04/11/16 1925 04/12/16 0130 04/12/16 1438 04/13/16 0413  WBC 7.3 9.2 8.4 7.9  HGB 9.6* 10.8* 10.0* 10.9*  HCT 29.7* 33.8* 31.6* 34.9*  PLT 238 282 284 253    COAGS:  Recent Labs  04/09/16 1558  INR 1.17  APTT 34    BMP:  Recent Labs  04/08/16 2035 04/10/16 0559  04/11/16 0350 04/12/16 0611  NA 132* 137 137 139  K 4.1 3.6 3.6 3.8  CL 89* 97* 101 103  CO2 31 25 27 29   GLUCOSE 109* 97 111* 111*  BUN 9 <5* <5* <5*  CALCIUM 9.3 8.7* 8.0* 8.3*  CREATININE 0.80 0.89 0.84 0.89  GFRNONAA >60 >60 >60 >60  GFRAA >60 >60 >60 >60    LIVER FUNCTION TESTS: No results for input(s): BILITOT, AST, ALT, ALKPHOS, PROT, ALBUMIN in the last 8760 hours.  Assessment and Plan:  LLE DVT thrombolysis 5/17-19 Doing well Pt will hear from OP IR clinic for follow up appt date and time 3-4 week with Dr Deanne Coffer to include venous doppler Need bilateral thigh high compression stockings for 3 mo  Elastic Therapy in [place] info and Rx (if needed) given to pt   Electronically Signed: Sagar Tengan A 04/13/2016, 9:15 AM   I spent a total of 15 Minutes at the the patient's bedside AND on the patient's hospital floor or unit, greater than 50% of which was counseling/coordinating care for LLE DVT lysis

## 2016-04-13 NOTE — Progress Notes (Signed)
[facility] Progress Note  Patient: Mark Orozco ZOX:[id number]   PCP: No primary care provider on file. DOB: [birth year]   DOA: 04/08/2016   DOS: 04/13/2016   Date of Service: the patient was seen and examined on 04/13/2016  Subjective: The patient complains of pain in his leg. Denies any other acute complaint. No chest pain abdominal pain. Nausea and vomiting absent Nutrition: Tolerating oral diet  Brief hospital course: Patient was admitted on 04/08/2016, with complaint of low back pain as well as leg pain, was found to have massive left lower extremity DVT. IR was consulted. Patient underwent mechanical thrombectomy as well as angioplasty. Patient was also given TNK, which finished on 04/12/2016. Patient continued heparin. Interventional radiology continues to follow the patient. At present no procedure prolonged. No active bleeding identified. Patient felt to be safely transferred to telemetry on 04/13/2016. We'll discuss with pharmacy regarding further anticoagulation. Currently further plan is to monitor her on heparin.  Assessment and Plan: 1. Recurrent acute left lower extremity DVT. A status post interventional radiology left lower extremity venogram, mechanical thrombectomy, balloon angioplasty of the femoral vein, TNK infusion. Currently on heparin drip. Interventional radiology following with the patient Patient will need bilateral thigh high compression stockings for 3 months.  2. Crohn's disease. - Followed by Dr. Norma Fredrickson who was at [facility] but now with [facility] - Continue home Lialda. - outpatient follow up with Dr. Norma Fredrickson.  3. Schizoaffective disorder. Continue home medication. At present no behavioral disturbances.  4. Hypokalemia. Oral replacement of potassium.  Pain management: Oxycodone 10 mg every 12 hours and Dilaudid 1-2 mg every 2 hours when necessary,  Activity: Ambulate as tolerated Bowel regimen: last BM prior to arrival Diet: Regular  diet DVT Prophylaxis: on therapeutic anticoagulation.  Advance goals of care discussion: Full code  Family Communication: no family was present at bedside, at the time of interview.   Disposition:  Discharge to home, may need home health due to leg pain Expected discharge date: 04/15/2016, pending or anticoagulation  Consultants: Interventional radiology Procedures: Left LE venogram. Mechanical thrombectomy of DVT with Angiojet. Balloon angioplasty of upper femoral vein and CFV.  Antibiotics: Anti-infectives    None        Intake/Output Summary (Last 24 hours) at 04/13/16 1644 Last data filed at 04/13/16 1400  Gross per 24 hour  Intake 1837.75 ml  Output   2825 ml  Net -987.25 ml   Filed Weights   04/08/16 1846  Weight: 84.823 kg (187 lb)    Objective: Physical Exam: Filed Vitals:   04/13/16 0800 04/13/16 0815 04/13/16 1149 04/13/16 1336  BP: 121/72  122/73 120/75  Pulse: 76  72 75  Temp:  98.4 F (36.9 C) 98.9 F (37.2 C) 98.7 F (37.1 C)  TempSrc:  Oral Oral Oral  Resp: 14  18 18   Height:      Weight:      SpO2: 98%  100% 99%    General: Alert, Awake and Oriented to Time, Place and Person. Appear in mild distress Eyes: PERRL, Conjunctiva normal ENT: Oral Mucosa clear moist. Neck: no JVD, no Abnormal Mass Or lumps Cardiovascular: S1 and S2 Present, aortic systolic Murmur, Peripheral Pulses Present Respiratory: Bilateral Air entry equal and Decreased, Clear to Auscultation, no Crackles, no wheezes Abdomen: Bowel Sound present, Soft and no tenderness Skin: no redness, n Rash  Extremities: ono Pedal edema, no calf tenderness Neurologic: Grossly no focal neuro deficit. Bilaterally Equal motor strength  Data Reviewed: CBC:  Recent Labs  Lab 04/08/16 2035  04/10/16 0559  04/11/16 1925 04/12/16 0130 04/12/16 1438 04/13/16 0413 04/13/16 0910  WBC 9.6  < > 8.8  < > 7.3 9.2 8.4 7.9 7.3  NEUTROABS 8.3*  --  5.8  --   --   --   --   --   --   HGB  12.6*  < > 11.6*  < > 9.6* 10.8* 10.0* 10.9* 11.0*  HCT 36.1*  < > 34.8*  < > 29.7* 33.8* 31.6* 34.9* 34.9*  MCV 77.1*  < > 79.5  < > 79.4 79.7 79.4 80.2 79.3  PLT 356  < > 430*  < > 238 282 284 253 343  < > = values in this interval not displayed. Basic Metabolic Panel:  Recent Labs Lab 04/08/16 2035 04/10/16 0559 04/11/16 0350 04/12/16 0611 04/13/16 0910  NA 132* 137 137 139 140  K 4.1 3.6 3.6 3.8 3.2*  CL 89* 97* 101 103 104  CO2 31 25 27 29 28   GLUCOSE 109* 97 111* 111* 94  BUN 9 <5* <5* <5* <5*  CREATININE 0.80 0.89 0.84 0.89 0.83  CALCIUM 9.3 8.7* 8.0* 8.3* 8.2*    Liver Function Tests: No results for input(s): AST, ALT, ALKPHOS, BILITOT, PROT, ALBUMIN in the last 168 hours. No results for input(s): LIPASE, AMYLASE in the last 168 hours. No results for input(s): AMMONIA in the last 168 hours. 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 Patient with development of new extensive left lower extremity venous thrombosis as such, patient underwent initiation of catheter directed thrombolysis performed 04/10/2016. Patient returns in interventional radiology department for venous lysis completion evaluation and intervention. EXAM: 1. IR THROMB F/U EVAL ART/VEN FINAL DAY; 2. FLUOROSCOPIC GUIDED THROMBECTOMY MECHANICAL VENOUS COMPARISON:  CT of the abdomen pelvis - 04/08/2016; initiation of left lower extremity catheter directed thrombolysis - 04/10/2016; subsequent catheter directed thrombolysis - 04/11/2016 MEDICATIONS: None. ANESTHESIA/SEDATION: Moderate (conscious) sedation was employed during this procedure. A total of Fentanyl 100 mcg was administered intravenously. Moderate Sedation Time: 20 minutes. The  patient's level of consciousness and vital signs were monitored continuously by radiology nursing throughout the procedure under my direct supervision. FLUOROSCOPY TIME:  Fluoroscopy Time: 3 minutes 48 seconds (129 mGy). COMPLICATIONS: None immediate. TECHNIQUE: Informed written consent was obtained from the patient after a thorough discussion of the procedural risks, benefits and alternatives. All questions were addressed. Maximal Sterile Barrier Technique was utilized including caps, mask, sterile gowns, sterile gloves, sterile drape, hand hygiene and skin antiseptic. A timeout was performed prior to the initiation of the procedure. The external portion of the existing left popliteal approach venous catheter as well as external portion of the infusion catheter were prepped and draped in usual sterile fashion. The wire of the infusion catheter was removed and a central left lower extremity venogram was performed. A thigh venogram was performed through the side arm of the popliteal vascular sheath. Over a Rosen wire, the infusion catheter was exchanged for a vertebral catheter and a pelvic venogram was performed. Next, 2 rounds of mechanical thrombectomy was performed throughout the left external common and superficial femoral veins with the use of an Angiojet device. Completion venogram images were obtained in the procedure was terminated. All wires  catheters and sheaths were removed from the patient. Hemostasis was achieved at the left popliteal access site with manual compression. A dressing was placed. The patient tolerated the procedure well without immediate postprocedural complication. FINDINGS: Initial left lower extremity venogram demonstrates restored patency with antegrade flow throughout the left femoral vein. A moderate to large amount of nonocclusive thrombus within the left external iliac and common femoral vein was treated with 2 rounds of mechanical thrombectomy with the use of an Angiojet device.  Completion pelvic venogram demonstrates restored antegrade flow through the pelvis with filling of multiple hypertrophied paraspinal venous collaterals with eventual opacification of the perirenal IVC at the level of the L3 vertebral body. A definitive infrarenal IVC was not identified. IMPRESSION: 1. Technically successful 2 day catheter directed thrombolysis of extensive left lower extremity DVT with restoration of antegrade flow throughout the left lower extremity to the level of hypertrophied paraspinal venous collaterals. 2. The perirenal IVC identified at the level of the L3 vertebral body. 3. An infrarenal IVC is not identified compatible with the findings suspected on preceding abdominal CT. PLAN: - The patient will be maintained on IV heparin until he is a transitioned to an appropriate outpatient anticoagulant as per the primary clinical team. Unfortunately, given the extent of dizziness disease, the patient will likely be maintained on anti coagulation indefinitely. - Recommend patient wear bilateral thigh high venous compression hoses at least for the duration of the summer. - Patient will be seen in follow-up consultation by initiating interventional radiologist, Dr. Deanne Coffer in approximately 3-4 weeks with repeat bilateral lower extremity venous Doppler ultrasound. Electronically Signed   By: Simonne Come M.D.   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 Patient with development of new extensive left lower extremity venous thrombosis as such, patient underwent initiation of catheter directed thrombolysis performed 04/10/2016. Patient returns in interventional radiology department for venous lysis completion evaluation and intervention. EXAM: 1. IR THROMB F/U EVAL ART/VEN  FINAL DAY; 2. FLUOROSCOPIC GUIDED THROMBECTOMY MECHANICAL VENOUS COMPARISON:  CT of the abdomen pelvis - 04/08/2016; initiation of left lower extremity catheter directed thrombolysis - 04/10/2016; subsequent catheter directed thrombolysis - 04/11/2016 MEDICATIONS: None. ANESTHESIA/SEDATION: Moderate (conscious) sedation was employed during this procedure. A total of Fentanyl 100 mcg was administered intravenously. Moderate Sedation Time: 20 minutes. The patient's level of consciousness and vital signs were monitored continuously by radiology nursing throughout the procedure under my direct supervision. FLUOROSCOPY TIME:  Fluoroscopy Time: 3 minutes 48 seconds (129 mGy). COMPLICATIONS: None immediate. TECHNIQUE: Informed written consent was obtained from the patient after a thorough discussion of the procedural risks, benefits and alternatives. All questions were addressed. Maximal Sterile Barrier Technique was utilized including caps, mask, sterile gowns, sterile gloves, sterile drape, hand hygiene and skin antiseptic. A timeout was performed prior to the initiation of the procedure. The external portion of the existing left popliteal approach venous catheter as well as external portion of the infusion catheter were prepped and draped in usual sterile fashion. The wire of the infusion catheter was removed and a central left lower extremity venogram was performed. A thigh venogram was performed through the side arm of the popliteal vascular sheath. Over a Rosen wire, the infusion catheter was exchanged for a vertebral catheter and a pelvic venogram was performed. Next, 2 rounds  of mechanical thrombectomy was performed throughout the left external common and superficial femoral veins with the use of an Angiojet device. Completion venogram images were obtained in the procedure was terminated. All wires catheters and sheaths were removed from the patient. Hemostasis was achieved at the left popliteal access site with  manual compression. A dressing was placed. The patient tolerated the procedure well without immediate postprocedural complication. FINDINGS: Initial left lower extremity venogram demonstrates restored patency with antegrade flow throughout the left femoral vein. A moderate to large amount of nonocclusive thrombus within the left external iliac and common femoral vein was treated with 2 rounds of mechanical thrombectomy with the use of an Angiojet device. Completion pelvic venogram demonstrates restored antegrade flow through the pelvis with filling of multiple hypertrophied paraspinal venous collaterals with eventual opacification of the perirenal IVC at the level of the L3 vertebral body. A definitive infrarenal IVC was not identified. IMPRESSION: 1. Technically successful 2 day catheter directed thrombolysis of extensive left lower extremity DVT with restoration of antegrade flow throughout the left lower extremity to the level of hypertrophied paraspinal venous collaterals. 2. The perirenal IVC identified at the level of the L3 vertebral body. 3. An infrarenal IVC is not identified compatible with the findings suspected on preceding abdominal CT. PLAN: - The patient will be maintained on IV heparin until he is a transitioned to an appropriate outpatient anticoagulant as per the primary clinical team. Unfortunately, given the extent of dizziness disease, the patient will likely be maintained on anti coagulation indefinitely. - Recommend patient wear bilateral thigh high venous compression hoses at least for the duration of the summer. - Patient will be seen in follow-up consultation by initiating interventional radiologist, Dr. Deanne Coffer in approximately 3-4 weeks with repeat bilateral lower extremity venous Doppler ultrasound. Electronically Signed   By: Simonne Come M.D.   On: 04/12/2016 17:52     Scheduled Meds: . benztropine  0.5 mg Oral BID  . famotidine  20 mg Oral BID  . fluPHENAZine  5 mg Oral BID  .  mesalamine  4.8 g Oral Q breakfast  . mirtazapine  30 mg Oral QHS  . Oxcarbazepine  300 mg Oral BID  . potassium chloride  40 mEq Oral Q2H  . sodium chloride flush  3 mL Intravenous Q12H  . sodium chloride flush  3 mL Intravenous Q12H  . Vilazodone HCl  20 mg Oral Daily   Continuous Infusions: . sodium chloride 75 mL/hr at 04/13/16 0721  . sodium chloride Stopped (04/12/16 1630)  . sodium chloride Stopped (04/12/16 1800)  . heparin 2,500 Units/hr (04/13/16 1227)   PRN Meds: sodium chloride, sodium chloride, baclofen, bisacodyl, diazepam, HYDROmorphone (DILAUDID) injection, ondansetron **OR** ondansetron (ZOFRAN) IV, oxyCODONE, polyethylene glycol, sodium chloride flush, sodium chloride flush  Time spent: 30 minutes  Author: Lynden Oxford, MD [facility] Pager: [phone] 04/13/2016 4:44 PM  If 7PM-7AM, please contact night-coverage at [email or web address], password [facility]

## 2016-04-13 NOTE — Progress Notes (Signed)
Pt mother called and expressed wishes that the pt have limited visitors. Pt mother is legal court appointed guardianship. This RN will covey message to day RN and team.

## 2016-04-13 NOTE — Progress Notes (Signed)
Question about AM labs validity. This RN reordered AM labs for patient safety. Lab/phlebotomy was made aware and will collect.

## 2016-04-13 NOTE — Progress Notes (Signed)
ANTICOAGULATION CONSULT NOTE Pharmacy Consult for Heparin Indication: DVT  Allergies  Allergen Reactions  . Ibuprofen     Messes stomach up  . Tylenol [Acetaminophen]     Messes stomach up    Patient Measurements: Height: 6\' 1"  (185.4 cm) Weight: 187 lb (84.823 kg) IBW/kg (Calculated) : 79.9 Heparin Dosing Weight: 84 kg  Vital Signs: Temp: 99.2 F (37.3 C) (05/20 0328) Temp Source: Oral (05/20 0328) BP: 122/72 mmHg (05/20 0100) Pulse Rate: 78 (05/20 0100)  Labs:  Recent Labs  04/10/16 0559  04/11/16 0350  04/11/16 1925 04/12/16 0130 04/12/16 0611 04/12/16 1438 04/13/16 0315  HGB 11.6*  < > 11.5*  < > 9.6* 10.8*  --  10.0*  --   HCT 34.8*  < > 34.8*  < > 29.7* 33.8*  --  31.6*  --   PLT 430*  < > 343  < > 238 282  --  284  --   HEPARINUNFRC 0.25*  < > 0.49  < >  --  0.11* 0.61 0.52 <0.10*  CREATININE 0.89  --  0.84  --   --   --  0.89  --   --   < > = values in this interval not displayed.  Estimated Creatinine Clearance: 138.4 mL/min (by C-G formula based on Cr of 0.89).  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 s/p 5/18 mechanical thrombectomy of DVT with Angiojet to persistent clot in upper femoral and iliac veins. Balloon angioplasty of upper femoral vein and CFV -switched to a traditional infusion catheter and started on TNK - ended 5/19 1800.   Heparin level now down to undetectable. Per RN, no issues with infusion since he came on at 1900. No bleeding noted. Unusual that it is now detectable after 2 therapeutic levels on similar dose.  Goal of Therapy:  Heparin level 0.3-0.7 units/ml  Monitor platelets by anticoagulation protocol: Yes   Plan:  Increase heparin to 2600 units/hr F/u 6 hr heparin level  Christoper Fabian, PharmD, BCPS Clinical pharmacist, pager [phone] 04/13/2016 4:09 AM

## 2016-04-13 NOTE — Progress Notes (Signed)
ANTICOAGULATION CONSULT NOTE - Follow Up Consult  Pharmacy Consult for Heparin Indication: DVT  Allergies  Allergen Reactions  . Ibuprofen     Messes stomach up  . Tylenol [Acetaminophen]     Messes stomach up    Patient Measurements: Height:  (185.4 cm) Weight: 187 lb (84.823 kg) IBW/kg (Calculated) : 79.9 Heparin Dosing Weight: 84 kg  Vital Signs: Temp: 98.4 F (36.9 C) (05/20 0815) Temp Source: Oral (05/20 0815) BP: 121/72 mmHg (05/20 0800) Pulse Rate: 76 (05/20 0800)  Labs:  Recent Labs  04/11/16 0350  04/12/16 0611 04/12/16 1438 04/13/16 0315 04/13/16 0413 04/13/16 0910  HGB 11.5*  < >  --  10.0*  --  10.9* 11.0*  HCT 34.8*  < >  --  31.6*  --  34.9* 34.9*  PLT 343  < >  --  284  --  253 343  HEPARINUNFRC 0.49  < > 0.61 0.52 <0.10*  --  0.76*  CREATININE 0.84  --  0.89  --   --   --  0.83  < > = values in this interval not displayed.  Estimated Creatinine Clearance: 148.4 mL/min (by C-G formula based on Cr of 0.83).  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 Also on 5/18 s/p mechanical thrombectomy of DVT with angiojet to persistent clot in upper femoral and iliac veins. Underwent balloon angioplasty of upper femoral vein and CFV, switched to a traditional infusion catheter and started on TNK - ended 5/19 1800.   Heparin level undetectable this morning and rate increased. Follow up heparin level is slightly above goal at 0.76. CBC stable. No bleeding.  Goal of Therapy:  Heparin level 0.3-0.7 units/ml  Monitor platelets by anticoagulation protocol: Yes   Plan:  1) Decrease heparin to 2500 units/hr 2) Check 6 hour heparin level  Louie Casa, PharmD, BCPS 04/13/2016 11:35 AM

## 2016-04-14 DIAGNOSIS — I82402 Acute embolism and thrombosis of unspecified deep veins of left lower extremity: Secondary | ICD-10-CM

## 2016-04-14 DIAGNOSIS — D509 Iron deficiency anemia, unspecified: Secondary | ICD-10-CM

## 2016-04-14 LAB — FIBRINOGEN
FIBRINOGEN: 399 mg/dL (ref 204–475)
FIBRINOGEN: 451 mg/dL (ref 204–475)
Fibrinogen: 361 mg/dL (ref 204–475)

## 2016-04-14 LAB — BASIC METABOLIC PANEL
Anion gap: 12 (ref 5–15)
BUN: <5 mg/dL — ABNORMAL LOW (ref 6–20)
CHLORIDE: 103 mmol/L (ref 101–111)
CO2: 25 mmol/L (ref 22–32)
CREATININE: 0.92 mg/dL (ref 0.61–1.24)
Calcium: 9.1 mg/dL (ref 8.9–10.3)
Glucose, Bld: 70 mg/dL (ref 65–99)
POTASSIUM: 4.3 mmol/L (ref 3.5–5.1)
SODIUM: 140 mmol/L (ref 135–145)

## 2016-04-14 LAB — CBC
HCT: 38.6 % — ABNORMAL LOW (ref 39.0–52.0)
HEMOGLOBIN: 12.1 g/dL — AB (ref 13.0–17.0)
MCH: 25.3 pg — ABNORMAL LOW (ref 26.0–34.0)
MCHC: 31.3 g/dL (ref 30.0–36.0)
MCV: 80.6 fL (ref 78.0–100.0)
Platelets: 427 10*3/uL — ABNORMAL HIGH (ref 150–400)
RBC: 4.79 MIL/uL (ref 4.22–5.81)
RDW: 15.1 % (ref 11.5–15.5)
WBC: 6.9 10*3/uL (ref 4.0–10.5)

## 2016-04-14 LAB — HEPARIN LEVEL (UNFRACTIONATED): HEPARIN UNFRACTIONATED: 1.1 [IU]/mL — AB (ref 0.30–0.70)

## 2016-04-14 MED ORDER — TRAMADOL HCL 50 MG PO TABS: 50.0000 mg | ORAL_TABLET | Freq: Four times a day (QID) | ORAL | Status: AC | PRN

## 2016-04-14 MED ORDER — RIVAROXABAN 20 MG PO TABS: 20.0000 mg | ORAL_TABLET | Freq: Every day | ORAL | Status: DC

## 2016-04-14 MED ORDER — HEPARIN (PORCINE) IN NACL 100-0.45 UNIT/ML-% IJ SOLN
1900.0000 [IU]/h | INTRAMUSCULAR | Status: DC
  Administered 2016-04-14: 1900 [IU]/h via INTRAVENOUS
  Filled 2016-04-14: qty 250

## 2016-04-14 MED ORDER — POLYETHYLENE GLYCOL 3350 17 G PO PACK: 17.0000 g | PACK | Freq: Every day | ORAL | Status: AC | PRN

## 2016-04-14 MED ORDER — HYDROCODONE-ACETAMINOPHEN 5-325 MG PO TABS
1.0000 | ORAL_TABLET | Freq: Four times a day (QID) | ORAL | Status: DC | PRN
  Administered 2016-04-14: 2 via ORAL
  Filled 2016-04-14: qty 2

## 2016-04-14 MED ORDER — HYDROCODONE-ACETAMINOPHEN 5-325 MG PO TABS: 1.0000 | ORAL_TABLET | Freq: Four times a day (QID) | ORAL | Status: DC | PRN

## 2016-04-14 MED ORDER — RIVAROXABAN (XARELTO) VTE STARTER PACK (15 & 20 MG): ORAL_TABLET | ORAL | Status: AC

## 2016-04-14 MED ORDER — RIVAROXABAN 15 MG PO TABS
15.0000 mg | ORAL_TABLET | Freq: Two times a day (BID) | ORAL | Status: DC
  Administered 2016-04-14: 15 mg via ORAL
  Filled 2016-04-14: qty 1

## 2016-04-14 NOTE — Progress Notes (Signed)
ANTICOAGULATION CONSULT NOTE - Follow Up Consult  Pharmacy Consult for xarelto Indication: DVT  Allergies  Allergen Reactions  . Ibuprofen     Messes stomach up  . Tylenol [Acetaminophen]     Messes stomach up    Patient Measurements: Height: 6\' 1"  (185.4 cm) Weight: 187 lb (84.823 kg) IBW/kg (Calculated) : 79.9  Vital Signs:    Labs:  Recent Labs  04/12/16 0611  04/13/16 0413 04/13/16 0910 04/13/16 1805 04/14/16 0333 04/14/16 0645  HGB  --   < > 10.9* 11.0*  --   --  12.1*  HCT  --   < > 34.9* 34.9*  --   --  38.6*  PLT  --   < > 253 343  --   --  427*  HEPARINUNFRC 0.61  < >  --  0.76* 0.86* 1.10*  --   CREATININE 0.89  --   --  0.83  --   --  0.92  < > = values in this interval not displayed.  Estimated Creatinine Clearance: 133.9 mL/min (by C-G formula based on Cr of 0.92).  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 Also on 5/18 s/p mechanical thrombectomy of DVT with angiojet to persistent clot in upper femoral and iliac veins. Underwent balloon angioplasty of upper femoral vein and CFV, switched to a traditional infusion catheter and started on TNK - ended 5/19 1800. Now transitioning to xarelto for anticoagulation.   Goal of Therapy:  Monitor platelets by anticoagulation protocol: Yes   Plan:  - DC heparin - Xarelto 15mg  PO BID x 21 days then 20mg  daily thereafter - F/u renal fxn, S&S of bleeding  Lysle Pearl, PharmD, BCPS Pager # [phone] 04/14/2016 10:09 AM

## 2016-04-14 NOTE — Progress Notes (Signed)
Pt mother requesting face to face with MD prior to discharge. Dr. Allena Katz paged.

## 2016-04-14 NOTE — Progress Notes (Signed)
04/14/2016 @ 1735 - Pt mother called saying that she has been unable to find a pharmacy to fill her Xarelto starter pack.  I called the [facility] pharmacy in [place], where they live, and pharmacist told me locations in [place] and [place] where the med appears to be in stock.  I called and confirmed that the [facility] [address] in [place] does indeed have in stock and they are open 24hrs.  Pt's mother informed and she plans to come back to get filled tonight.

## 2016-04-14 NOTE — Progress Notes (Signed)
ANTICOAGULATION CONSULT NOTE - Follow Up Consult  Pharmacy Consult for heparin Indication: DVT   Labs:  Recent Labs  04/12/16 0611 04/12/16 1438  04/13/16 0413 04/13/16 0910 04/13/16 1805 04/14/16 0333  HGB  --  10.0*  --  10.9* 11.0*  --   --   HCT  --  31.6*  --  34.9* 34.9*  --   --   PLT  --  284  --  253 343  --   --   HEPARINUNFRC 0.61 0.52  < >  --  0.76* 0.86* 1.10*  CREATININE 0.89  --   --   --  0.83  --   --   < > = values in this interval not displayed.   Assessment: 30yo male now with higher heparin level despite decreased rate; lab drawn correctly.  Goal of Therapy:  Heparin level 0.3-0.7 units/ml   Plan:  Will hold heparin gtt x56min then resume heparin gtt at lower rate of 1900 units/hr and check level in 6hr.  Vernard Gambles, PharmD, BCPS  04/14/2016,4:15 AM

## 2016-04-14 NOTE — Progress Notes (Signed)
Pt ambulated independently in halls 439ft without difficulty.  Stated leg felt "stiff" but no other complaints.  Per MD order pt is cleared for discharge.  IVs and tele removed.  CCMD notified.  Pt mother is on her way to pick up.

## 2016-04-14 NOTE — Progress Notes (Signed)
Dr. Allena Katz has spoken with pt and family.  Discharge instructions have been reviewed.  Pt states that he cannot take the ordered Norco for pain due to intolerant of acetaminophen with Chrone's disease.  Dr. Allena Katz paged again, has written for tramadol, awaiting signature for Rx prior to pt being released.

## 2016-04-14 NOTE — Progress Notes (Signed)
CM met with pt and mother of pt in room and gave pt free 30 day trial card for Xarelto.  Family verbalized understanding this card will pay for today's discharge prescription and give insurance enough time to authorize for refills.  No other CM needs were communicated.

## 2016-04-14 NOTE — Discharge Instructions (Signed)
Information on my medicine - XARELTO (rivaroxaban)  This medication education was reviewed with me or my healthcare representative as part of my discharge preparation.   WHY WAS XARELTO PRESCRIBED FOR YOU? Xarelto was prescribed to treat blood clots that may have been found in the veins of your legs (deep vein thrombosis) or in your lungs (pulmonary embolism) and to reduce the risk of them occurring again.  What do you need to know about Xarelto? The starting dose is one 15 mg tablet taken TWICE daily with food for the FIRST 21 DAYS then on (enter date)  6/11  the dose is changed to one 20 mg tablet taken ONCE A DAY with your evening meal.  DO NOT stop taking Xarelto without talking to the health care provider who prescribed the medication.  Refill your prescription for 20 mg tablets before you run out.  After discharge, you should have regular check-up appointments with your healthcare provider that is prescribing your Xarelto.  In the future your dose may need to be changed if your kidney function changes by a significant amount.  What do you do if you miss a dose? If you are taking Xarelto TWICE DAILY and you miss a dose, take it as soon as you remember. You may take two 15 mg tablets (total 30 mg) at the same time then resume your regularly scheduled 15 mg twice daily the next day.  If you are taking Xarelto ONCE DAILY and you miss a dose, take it as soon as you remember on the same day then continue your regularly scheduled once daily regimen the next day. Do not take two doses of Xarelto at the same time.   Important Safety Information Xarelto is a blood thinner medicine that can cause bleeding. You should call your healthcare provider right away if you experience any of the following: ? Bleeding from an injury or your nose that does not stop. ? Unusual colored urine (red or dark brown) or unusual colored stools (red or black). ? Unusual bruising for unknown reasons. ? A  serious fall or if you hit your head (even if there is no bleeding).  Some medicines may interact with Xarelto and might increase your risk of bleeding while on Xarelto. To help avoid this, consult your healthcare provider or pharmacist prior to using any new prescription or non-prescription medications, including herbals, vitamins, non-steroidal anti-inflammatory drugs (NSAIDs) and supplements.  This website has more information on Xarelto: VisitDestination.com.br.

## 2016-04-14 NOTE — Progress Notes (Signed)
Case management will be around to see the pt and his mother in about half an hour regarding new rx of xarelto.  Dr. Allena Katz wants the consult before finalizing discharge rec.

## 2016-04-14 NOTE — Discharge Summary (Signed)
[facility] Discharge Summary   Patient: Mark Orozco ZOX:[id number]   PCP: Ananias Pilgrim, MD DOB: 1986/05/18   Date of admission: 04/08/2016   Date of discharge:  04/14/2016    Discharge Diagnoses:  Active Problems:   Leg DVT (deep venous thromboembolism), acute (HCC)   Crohn's disease (HCC)   Schizoaffective disorder (HCC)   Microcytic anemia  Recommendations for Outpatient Follow-up:  1.  Please follow-up with PCP in one week as well as interventional radiology as scheduled 2. Please remain compliant with her medications  Follow-up Information    Follow up with HASSELL III, DAYNE DANIEL, MD In 4 weeks.   Specialty:  Interventional Radiology   Why:  pt will hear from IR Clinic with follow up date and time   Contact information:   [address] [place] [address] [phone]       Follow up with ASRES,ALEHEGN, MD. Schedule an appointment as soon as possible for a visit in 1 week.   Specialty:  Family Medicine   Contact information:   [address] [place] [address] [phone]      Diet recommendation: Regular diet  Activity: The patient is advised to gradually reintroduce usual activities.  Discharge Condition: good  History of present illness: As per the H and P dictated on admission, "Drako Beeck is a 30 y.o. male with medical history significant for premature birth (4 months early), Crohn's disease, recurrent RLE DVTs and schizoaffective disorder, bipolar type. Patient normally receives care at [facility], he has no records in the [facility] system.  Per mother, patient was born 4 months premature. She describes premature birth been complicated by a collapse in patient's vascular system requiring some deep cuts to patient's thighs. Around age 8 patient began having problems with right lower extremity DVTs, he has had several and mother states they are secondary to the cuts made into thighs as infant. Patient followed for years by  vascular surgery at [facility]. It sounds like there was talk of an IVC filter at one time but patient told the clot with never ascend.   Several days ago patient began having lower back pain. He was seen at an a [facility] urgent care center, given a Toradol injection and prescribed a muscle relaxer . For persistent pain patient's mother took him to [facility] Internal [facility], MRI of lumbar spine revealed lumbar disc disease . Patient prescribed pain medication. The pain did not improve and began radiating into back of left left thigh and inner left thigh. Pain constant. No alleviating factors. Patient's mother took him back to [facility] yesterday where where an ultrasound of the left leg was ordered must have shown massive LLE DVT and patient directed to [facility] ED.  Patient has little Gastroenterology records in [facility] . He was diagnosed in 2011 and has been maintained on Lialda. No history of bowel resections . Usually averages 1-2 solid bowel movements a day. Patient has been fasting for health reasons and last bowel movement was 4 days ago. No blood in stools. Appetite adequate, weight stable"  Hospital Course:  Summary of his active problems in the hospital is as following.  1. Recurrent acute left lower extremity DVT. status post interventional radiology left lower extremity venogram, mechanical thrombectomy, balloon angioplasty of the femoral vein, TNK infusion. Patient was then transitioned to heparin drip. Interventional radiology recommended outpatient follow-up for further treatment of his DVT Patient will need bilateral thigh high compression stockings for 3 months. Patient transitioned to  oral Xarelto Case manager provided 30 day supply card. Patient and family verbalized understanding regarding use of medication  2. Crohn's disease. - Followed by Dr. Norma Fredrickson who was at [facility] but now with [facility] - Continue home Lialda. - outpatient follow up with Dr.  Norma Fredrickson.  3. Schizoaffective disorder. Continue home medication. At present no behavioral disturbances.  4. Hypokalemia. Oral replacement of potassium.    5. Pain management. As per patient's mother patient is not on any long-term pain management medication. Patient was recently prescribed oxycodone 12 hour tablets for his back pain. In the hospital patient has received multiple doses of IV narcotics. Pain has been improving and well-tolerated and therefore patient will be discharged on tramadol. Patient refused to take any medication containing Tylenol.  All other chronic medical condition were stable during the hospitalization.  Patient was ambulatory without any assistance. On the day of the discharge the patient's vitals were stable, and no other acute medical condition were reported by patient. the patient was felt safe to be discharge at home with family.  Procedures and Results:   Left LE venogram.  Mechanical thrombectomy of DVT with Angiojet.  Balloon angioplasty of upper femoral vein and CFV.  TNK infusion  Consultations:  Case management, intervention radiology  DISCHARGE MEDICATION: Current Discharge Medication List    START taking these medications   Details  polyethylene glycol (MIRALAX / GLYCOLAX) packet Take 17 g by mouth daily as needed for mild constipation. Qty: 14 each, Refills: 0    Rivaroxaban 15 & 20 MG TBPK Take as directed on package: Start with one 15mg  tablet by mouth twice a day with food. On Day 22, switch to one 20mg  tablet once a day with food. Qty: 51 each, Refills: 0    traMADol (ULTRAM) 50 MG tablet Take 1 tablet (50 mg total) by mouth every 6 (six) hours as needed. Qty: 30 tablet, Refills: 0      CONTINUE these medications which have NOT CHANGED   Details  BACLOFEN PO Take 20 mg by mouth every 8 (eight) hours as needed (muscle spasms).     benztropine (COGENTIN) 0.5 MG tablet Take 0.5 mg by mouth 2 (two) times daily.      clopidogrel (PLAVIX) 75 MG tablet Take 75 mg by mouth daily.    DIAZEPAM PO Take 5 mg by mouth 4 (four) times daily.     FAMOTIDINE PO Take 20 mg by mouth 2 (two) times daily.     fluPHENAZine (PROLIXIN) 5 MG tablet Take 5 mg by mouth 2 (two) times daily.    mesalamine (LIALDA) 1.2 g EC tablet Take 4.8 g by mouth daily with breakfast.     mirtazapine (REMERON) 30 MG tablet Take 30 mg by mouth at bedtime.    Oxcarbazepine (TRILEPTAL) 300 MG tablet Take 300 mg by mouth 2 (two) times daily.    OXYCODONE HCL PO Take by mouth.    Paliperidone Palmitate (INVEGA SUSTENNA IM) Inject 156 mg into the muscle every 30 (thirty) days.     Vilazodone HCl (VIIBRYD) 20 MG TABS Take 20 mg by mouth daily.      STOP taking these medications     PREDNISONE PO        Allergies  Allergen Reactions  . Ibuprofen     Messes stomach up  . Tylenol [Acetaminophen]     Messes stomach up   Discharge Instructions    Compression stockings    Complete by:  As directed  Diet - low sodium heart healthy    Complete by:  As directed      Discharge instructions    Complete by:  As directed   It is important that you read following instructions as well as go over your medication list with RN to help you understand your care after this hospitalization.  Discharge Instructions: Please follow-up with PCP in one week You will hear from IR clinic for follow up appt date and time, in 3-4 week with Dr Deanne Coffer to include venous doppler. Need bilateral thigh high compression stockings for 3 months [facility] in [place] info and Rx (if needed) given to pt.  Please request your primary care physician to go over all Hospital Tests and Procedure/Radiological results at the follow up,  Please get all Hospital records sent to your PCP by signing hospital release before you go home.   Do not drive, operating heavy machinery, perform activities at heights, swimming or participation in water activities or  provide baby sitting services while your are on Pain, Sleep and Anxiety Medications; until you have been seen by Primary Care Physician or a Neurologist and advised to do so again. Do not take more than prescribed Pain, Sleep and Anxiety Medications. You were cared for by a hospitalist during your hospital stay. If you have any questions about your discharge medications or the care you received while you were in the hospital after you are discharged, you can call the unit and ask to speak with the hospitalist on call if the hospitalist that took care of you is not available.  Once you are discharged, your primary care physician will handle any further medical issues. Please note that NO REFILLS for any discharge medications will be authorized once you are discharged, as it is imperative that you return to your primary care physician (or establish a relationship with a primary care physician if you do not have one) for your aftercare needs so that they can reassess your need for medications and monitor your lab values. You Must read complete instructions/literature along with all the possible adverse reactions/side effects for all the Medicines you take and that have been prescribed to you. Take any new Medicines after you have completely understood and accept all the possible adverse reactions/side effects. Wear Seat belts while driving. If you have smoked or chewed Tobacco in the last 2 yrs please stop smoking and/or stop any Recreational drug use.     Increase activity slowly    Complete by:  As directed           Discharge Exam: Filed Weights   04/08/16 1846  Weight: 84.823 kg (187 lb)   Filed Vitals:   04/13/16 1336 04/13/16 2054  BP: 120/75 141/89  Pulse: 75 11  Temp: 98.7 F (37.1 C) 97.6 F (36.4 C)  Resp: 18 18   General: Appear in no distress, no Rash; Oral Mucosa moist. Cardiovascular: S1 and S2 Present, no Murmur, no JVD Respiratory: Bilateral Air entry present and Clear to  Auscultation, no Crackles, no wheezes Abdomen: Bowel Sound present, Soft and no tenderness Extremities: no Pedal edema, n calf tenderness Neurology: Grossly no focal neuro deficit.  The results of significant diagnostics from this hospitalization (including imaging, microbiology, ancillary and laboratory) are listed below for reference.    Significant Diagnostic Studies: Ir Veno/ext/uni Left  04/10/2016  CLINICAL DATA:  History of multiple femoral venous catheterization such as a premature neonate. History of right lower extremity venous thrombosis. 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 MEDICATIONS: Lidocaine 1% subcutaneous CONTRAST:  20mL ISOVUE-300 IOPAMIDOL (ISOVUE-300) INJECTION 61% PROCEDURE: The procedure, risks (including but not limited to bleeding, infection, organ damage ), benefits, and alternatives were explained to the patient and family. Questions regarding the procedure were encouraged and answered. The patient understands and consents to the procedure. Patient placed prone on the procedure table. Left leg prepped and draped in usual sterile fashion. Maximal barrier sterile technique was utilized including caps, mask, sterile gowns, sterile gloves, sterile drape, hand hygiene and skin antiseptic. After time-out, moderate sedation was initiated. After the skin was infiltrated with lidocaine, the left posterior tibial vein was accessed under ultrasound guidance with a 21 gauge micropuncture set at  the proximal calf level. Venography was performed. The micropuncture dilator was exchanged for a 6 [place] vascular sheath, through which a 5 French angled angiographic catheter was advanced for left lower extremity venography. The catheter was exchanged over a guidewire for the 6 French EKOS infusion device, placed from the popliteal vein to the left common iliac vein. Catheter and sheath were secured externally and catheter directed ultrasound assisted thrombolytic infusion was initiated. Patient transferred to ICU for observation. The patient tolerated the procedure well. COMPLICATIONS: None immediate FINDINGS: Initial left lower extremity venography demonstrated occlusive DVT extending from posterior tibial veins through the popliteal, femoral, common femoral, external iliac, and common iliac veins. Direct communication to the IVC was not demonstrated, consistent with recent CT findings. An infusion catheters placed from the popliteal vein to the left common iliac vein and ultrasound assisted venous thrombo lysis was initiated. IMPRESSION: 1. Extensive occlusive acute thrombus through the posterior tibial, popliteal, femoral, common femoral, left external and common iliac veins. 2. 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 3. Initiation of catheter directed ultrasound assisted thrombolysis of the extensive left lower extremity and pelvic DVT as above. Follow-up venography planned in the morning. Electronically Signed   By: Corlis Leak M.D.   On: 04/10/2016 12:49   Ir Thrombect Veno Mech Mod Sed  04/11/2016  INDICATION: 30 year old with left lower extremity DVT. One day follow-up of catheter directed thrombolytic therapy. EXAM: FOLLOW-UP OF CATHETER DIRECTED THROMBOLYTIC THERAPY - SUBSEQUENT DAY TRANSCATHETER MECHANICAL THROMBECTOMY VENOUS BALLOON ANGIOPLASTY COMPARISON:  None. MEDICATIONS: None. ANESTHESIA/SEDATION: Versed 1 mg IV;  Fentanyl 12.5 mcg IV Moderate Sedation Time:  60 minutes The patient was continuously monitored during the procedure by the interventional radiology nurse under my direct supervision. FLUOROSCOPY TIME:  Fluoroscopy Time: 7 minutes 30 seconds (66 mGy). COMPLICATIONS: None immediate. TECHNIQUE: Patient was placed prone on the interventional table. The existing catheter and the back of the left leg were prepped and draped in a sterile fashion. Maximal barrier sterile technique was utilized including caps, mask, sterile gowns, sterile gloves, sterile drape, hand hygiene and skin antiseptic. Contrast was injected through the 6 French sheath side port. Left lower extremity venogram was performed. The EKOS catheter was removed and a 5 French catheter was advanced into the upper leg and additional venography was performed. A Rosen wire was placed.  The iliac veins and upper femoral vein underwent mechanical thrombectomy with the Angiojet device. Follow-up venography was performed. The upper femoral vein and common femoral vein were treated with 6 mm x 40 mm balloon. Follow-up venography was performed. A ew 90 cm infusion catheter was placed. This catheter has 40 cm of infusion length. Tip of the catheter was placed in the left iliac veins. Catheter directed thrombolysis was continued using tenecteplase. FINDINGS: There was improved flow in the left femoral vein. There was persistent occlusion in the upper femoral vein and the iliac veins. Patent collateral veins in the upper pelvis that appear to be draining through the lumbar venous system. 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 Following thrombectomy and balloon angioplasty, there was flow throughout the left femoral vein with some residual nonocclusive clot. However, there continued to be a large clot burden in the common femoral vein and external iliac vein. Therefore, the infusion catheter was  placed. IMPRESSION: Improved venous flow in the left lower extremity and left iliac veins following catheter directed thrombolysis, mechanical thrombectomy and balloon angioplasty. There continues to be a large clot burden in the pelvis. Therefore, will continue catheter directed thrombolysis for at least 1 more day. Electronically Signed   By: Richarda Overlie M.D.   On: 04/11/2016 17:50   Ir Thrombect Veno Mech Rept Mod Sed  04/12/2016  INDICATION: History of multiple prior femoral venous catheterizations as a premature neonate. 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 Patient with development of new extensive left lower extremity venous thrombosis as such, patient underwent initiation of catheter directed thrombolysis performed 04/10/2016. Patient returns in interventional radiology department for venous lysis completion evaluation and intervention. EXAM: 1. IR THROMB F/U EVAL ART/VEN FINAL DAY; 2. FLUOROSCOPIC GUIDED THROMBECTOMY MECHANICAL VENOUS COMPARISON:  CT of the abdomen pelvis - 04/08/2016; initiation of left lower extremity catheter directed thrombolysis - 04/10/2016; subsequent catheter directed thrombolysis - 04/11/2016 MEDICATIONS: None. ANESTHESIA/SEDATION: Moderate (conscious) sedation was employed during this procedure. A total of Fentanyl 100 mcg was administered intravenously. Moderate Sedation Time: 20 minutes. The patient's level of consciousness and vital signs were monitored continuously by radiology nursing throughout the procedure under my direct supervision. FLUOROSCOPY TIME:  Fluoroscopy Time: 3 minutes 48 seconds (129 mGy). COMPLICATIONS: None immediate. TECHNIQUE: Informed written consent was obtained from the patient after a thorough discussion of the procedural risks, benefits and alternatives. All questions were addressed. Maximal Sterile Barrier Technique was utilized including caps, mask, sterile gowns, sterile gloves,  sterile drape, hand hygiene and skin antiseptic. A timeout was performed prior to the initiation of the procedure. The external portion of the existing left popliteal approach venous catheter as well as external portion of the infusion catheter were prepped and draped in usual sterile fashion. The wire of the infusion catheter was removed and a central left lower extremity venogram was performed. A thigh venogram was performed through the side arm of the popliteal vascular sheath. Over a Rosen wire, the infusion catheter was exchanged for a vertebral catheter and a pelvic venogram was performed. Next, 2 rounds of mechanical thrombectomy was performed throughout the left external common and superficial femoral veins with the use of an Angiojet device. Completion venogram images were obtained in the procedure was terminated. All wires catheters and sheaths were removed from the patient. Hemostasis was achieved at the left popliteal access site with manual compression. A  dressing was placed. The patient tolerated the procedure well without immediate postprocedural complication. FINDINGS: Initial left lower extremity venogram demonstrates restored patency with antegrade flow throughout the left femoral vein. A moderate to large amount of nonocclusive thrombus within the left external iliac and common femoral vein was treated with 2 rounds of mechanical thrombectomy with the use of an Angiojet device. Completion pelvic venogram demonstrates restored antegrade flow through the pelvis with filling of multiple hypertrophied paraspinal venous collaterals with eventual opacification of the perirenal IVC at the level of the L3 vertebral body. A definitive infrarenal IVC was not identified. IMPRESSION: 1. Technically successful 2 day catheter directed thrombolysis of extensive left lower extremity DVT with restoration of antegrade flow throughout the left lower extremity to the level of hypertrophied paraspinal venous  collaterals. 2. The perirenal IVC identified at the level of the L3 vertebral body. 3. An infrarenal IVC is not identified compatible with the findings suspected on preceding abdominal CT. PLAN: - The patient will be maintained on IV heparin until he is a transitioned to an appropriate outpatient anticoagulant as per the primary clinical team. Unfortunately, given the extent of dizziness disease, the patient will likely be maintained on anti coagulation indefinitely. - Recommend patient wear bilateral thigh high venous compression hoses at least for the duration of the summer. - Patient will be seen in follow-up consultation by initiating interventional radiologist, Dr. Deanne Coffer in approximately 3-4 weeks with repeat bilateral lower extremity venous Doppler ultrasound. Electronically Signed   By: Simonne Come M.D.   On: 04/12/2016 17:52   Ir US Guide Vasc Access Left  04/10/2016  CLINICAL DATA:  History of multiple femoral venous catheterization such as a premature neonate. History of right lower extremity venous thrombosis. 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 MEDICATIONS: Lidocaine 1% subcutaneous CONTRAST:  20mL ISOVUE-300 IOPAMIDOL (ISOVUE-300) INJECTION 61% PROCEDURE: The procedure, risks (including but not limited to bleeding, infection, organ damage ), benefits, and alternatives were explained to the patient and family. Questions regarding the procedure were encouraged and answered. The patient understands and  consents to the procedure. Patient placed prone on the procedure table. Left leg prepped and draped in usual sterile fashion. Maximal barrier sterile technique was utilized including caps, mask, sterile gowns, sterile gloves, sterile drape, hand hygiene and skin antiseptic. After time-out, moderate sedation was initiated. After the skin was infiltrated with lidocaine, the left posterior tibial vein was accessed under ultrasound guidance with a 21 gauge micropuncture set at the proximal calf level. Venography was performed. The micropuncture dilator was exchanged for a 6 [place] vascular sheath, through which a 5 French angled angiographic catheter was advanced for left lower extremity venography. The catheter was exchanged over a guidewire for the 6 French EKOS infusion device, placed from the popliteal vein to the left common iliac vein. Catheter and sheath were secured externally and catheter directed ultrasound assisted thrombolytic infusion was initiated. Patient transferred to ICU for observation. The patient tolerated the procedure well. COMPLICATIONS: None immediate FINDINGS: Initial left lower extremity venography demonstrated occlusive DVT extending from posterior tibial veins through the popliteal, femoral, common femoral, external  iliac, and common iliac veins. Direct communication to the IVC was not demonstrated, consistent with recent CT findings. An infusion catheters placed from the popliteal vein to the left common iliac vein and ultrasound assisted venous thrombo lysis was initiated. IMPRESSION: 1. Extensive occlusive acute thrombus through the posterior tibial, popliteal, femoral, common femoral, left external and common iliac veins. 2. 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 3. Initiation of catheter directed ultrasound assisted thrombolysis of the extensive left lower extremity and pelvic DVT as above. Follow-up  venography planned in the morning. Electronically Signed   By: Corlis Leak M.D.   On: 04/10/2016 12:49   Ir Pta Venous Except Dialysis Circuit  04/11/2016  INDICATION: 30 year old with left lower extremity DVT. One day follow-up of catheter directed thrombolytic therapy. EXAM: FOLLOW-UP OF CATHETER DIRECTED THROMBOLYTIC THERAPY - SUBSEQUENT DAY TRANSCATHETER MECHANICAL THROMBECTOMY VENOUS BALLOON ANGIOPLASTY COMPARISON:  None. MEDICATIONS: None. ANESTHESIA/SEDATION: Versed 1 mg IV; Fentanyl 12.5 mcg IV Moderate Sedation Time:  60 minutes The patient was continuously monitored during the procedure by the interventional radiology nurse under my direct supervision. FLUOROSCOPY TIME:  Fluoroscopy Time: 7 minutes 30 seconds (66 mGy). COMPLICATIONS: None immediate. TECHNIQUE: Patient was placed prone on the interventional table. The existing catheter and the back of the left leg were prepped and draped in a sterile fashion. Maximal barrier sterile technique was utilized including caps, mask, sterile gowns, sterile gloves, sterile drape, hand hygiene and skin antiseptic. Contrast was injected through the 6 French sheath side port. Left lower extremity venogram was performed. The EKOS catheter was removed and a 5 French catheter was advanced into the upper leg and additional venography was performed. A Rosen wire was placed. The iliac veins and upper femoral vein underwent mechanical thrombectomy with the Angiojet device. Follow-up venography was performed. The upper femoral vein and common femoral vein were treated with 6 mm x 40 mm balloon. Follow-up venography was performed. A ew 90 cm infusion catheter was placed. This catheter has 40 cm of infusion length. Tip of the catheter was placed in the left iliac veins. Catheter directed thrombolysis was continued using tenecteplase. FINDINGS: There was improved flow in the left femoral vein. There was persistent occlusion in the upper femoral vein and the iliac veins. Patent  collateral veins in the upper pelvis that appear to be draining through the lumbar venous system. 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 Following thrombectomy and balloon angioplasty, there was flow throughout the left femoral vein with some residual nonocclusive clot. However, there continued to be a large clot burden in the common femoral vein and external iliac vein. Therefore, the infusion catheter was placed. IMPRESSION: Improved venous flow in the left lower extremity and left iliac veins following catheter directed thrombolysis, mechanical thrombectomy and balloon angioplasty. There continues to be a large clot burden in the pelvis. Therefore, will continue catheter directed thrombolysis for at least 1 more day. Electronically Signed   By: Richarda Overlie M.D.   On: 04/11/2016 17:50   Ct Venogram Abd/pel  04/08/2016  CLINICAL DATA:  Patient was diagnosed with deep venous thrombosis today in the left leg. Back pain. EXAM: CT venogram of  the abdomen, pelvis, and lower extremities TECHNIQUE: Axial CT images of the abdomen, pelvis, and bilateral lower extremities from hip to below the knee are obtained with contrast material during venous phase. Additional sagittal and  coronal multiplanar reformatted images and maximal intensity projection images are obtained. CONTRAST:  100 mL Isovue 370 COMPARISON:  None. FINDINGS: CT venogram: There is dilatation and filling defect consistent with deep venous thrombosis involving the left tibioperoneal trunk, left popliteal vein, left deep femoral vein, left common femoral vein, left external iliac vein, and left internal iliac vein. Left deep femoral vein is also involved. The inferior vena cava and iliac veins are somewhat flattened appear patent. Right pelvic and right leg veins are patent. Left leg veins demonstrate infiltration in the fat around the venous structures possibly due to obstruction or  possibly indicating infection. Thrombophlebitis is not excluded. Mild diffuse soft tissue edema in the left leg. CT abdomen and pelvis: Lung bases are clear. The liver, spleen, gallbladder, pancreas, adrenal glands, kidneys, abdominal aorta, and retroperitoneal lymph nodes are unremarkable. Stomach, small bowel, and colon are not abnormally distended. No free air or free fluid in the abdomen. Pelvis: Bladder wall is not thickened. No pelvic mass or lymphadenopathy. No free or loculated pelvic fluid collections. Prostate gland is not enlarged. Edema in the soft tissues with superficial S collaterals. Scattered lymph nodes in the pelvis are not abnormally distended. Appendix is normal. No destructive bone lesions. IMPRESSION: Deep venous thrombosis demonstrated involving the left pelvic veins and deep venous system of the left leg, extending from the left external iliac, internal iliac, common femoral, superficial and deep femoral, popliteal, and tibial peroneal veins. Associated edema and collateral venous structures demonstrated. Infiltration around the deep veins of the left leg may indicate edema or phlebitis. Electronically Signed   By: Burman Nieves M.D.   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 History of right lower extremity venous thrombosis. 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 MEDICATIONS: Lidocaine 1% subcutaneous CONTRAST:  20mL ISOVUE-300 IOPAMIDOL (ISOVUE-300) INJECTION 61% PROCEDURE: The procedure, risks (including but not limited to bleeding, infection, organ damage ), benefits, and alternatives were explained to the patient and family. Questions regarding the procedure were encouraged and answered. The patient understands and consents to the procedure. Patient placed prone on the procedure table. Left leg prepped and draped in usual sterile fashion. Maximal barrier sterile technique was utilized including caps, mask, sterile gowns, sterile gloves, sterile drape, hand hygiene and skin antiseptic. After time-out, moderate sedation was initiated. After the skin was infiltrated with lidocaine, the left posterior tibial vein was accessed under ultrasound guidance with a 21 gauge micropuncture set at the proximal calf level. Venography was performed. The micropuncture dilator was exchanged for a 6 [place] vascular sheath, through which a 5 French angled angiographic catheter was advanced for left lower extremity venography. The catheter was exchanged over a guidewire for the 6 French EKOS infusion device, placed from the popliteal vein to the left common iliac vein. Catheter and sheath were secured externally and catheter directed ultrasound assisted thrombolytic infusion was initiated. Patient transferred to ICU for observation. The patient tolerated the procedure well. COMPLICATIONS: None immediate FINDINGS: Initial left lower  extremity venography demonstrated occlusive DVT extending from posterior tibial veins through the popliteal, femoral, common femoral, external iliac, and common iliac veins. Direct communication to the IVC was not demonstrated, consistent with recent CT findings. An infusion catheters placed from the popliteal vein to the left common iliac vein and ultrasound assisted venous thrombo lysis was initiated. IMPRESSION: 1. Extensive occlusive acute thrombus  through the posterior tibial, popliteal, femoral, common femoral, left external and common iliac veins. 2. 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 3. Initiation of catheter directed ultrasound assisted thrombolysis of the extensive left lower extremity and pelvic DVT as above. Follow-up venography planned in the morning. Electronically Signed   By: Corlis Leak M.D.   On: 04/10/2016 12:49   Ir Rande Lawman F/u Eval Art/ven Basil Dess Day (ms)  04/11/2016  INDICATION: 30 year old with left lower extremity DVT. One day follow-up of catheter directed thrombolytic therapy. EXAM: FOLLOW-UP OF CATHETER DIRECTED THROMBOLYTIC THERAPY - SUBSEQUENT DAY TRANSCATHETER MECHANICAL THROMBECTOMY VENOUS BALLOON ANGIOPLASTY COMPARISON:  None. MEDICATIONS: None. ANESTHESIA/SEDATION: Versed 1 mg IV; Fentanyl 12.5 mcg IV Moderate Sedation Time:  60 minutes The patient was continuously monitored during the procedure by the interventional radiology nurse under my direct supervision. FLUOROSCOPY TIME:  Fluoroscopy Time: 7 minutes 30 seconds (66 mGy). COMPLICATIONS: None immediate. TECHNIQUE: Patient was placed prone on the interventional table. The existing catheter and the back of the left leg were prepped and draped in a sterile fashion. Maximal barrier sterile technique was utilized including caps, mask, sterile gowns, sterile gloves, sterile drape, hand hygiene and skin antiseptic. Contrast was injected through the 6 French sheath side port. Left lower extremity venogram was performed. The EKOS catheter was removed and a 5 French catheter was advanced into the upper leg and additional venography was performed. A Rosen wire was placed. The iliac veins and upper femoral vein underwent mechanical thrombectomy with the Angiojet device. Follow-up venography was performed. The upper femoral vein and common femoral vein were treated with 6 mm x 40 mm balloon. Follow-up  venography was performed. A ew 90 cm infusion catheter was placed. This catheter has 40 cm of infusion length. Tip of the catheter was placed in the left iliac veins. Catheter directed thrombolysis was continued using tenecteplase. FINDINGS: There was improved flow in the left femoral vein. There was persistent occlusion in the upper femoral vein and the iliac veins. Patent collateral veins in the upper pelvis that appear to be draining through the lumbar venous system. 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 Following thrombectomy and balloon angioplasty, there was flow throughout the left femoral vein with some residual nonocclusive clot. However, there continued to be a large clot burden in the common femoral vein and external iliac vein. Therefore, the infusion catheter was placed. IMPRESSION: Improved venous flow in the left lower extremity and left iliac veins following catheter directed thrombolysis, mechanical thrombectomy and balloon angioplasty. There continues to be a large clot burden in the pelvis. Therefore, will continue catheter directed thrombolysis for at least 1 more day. Electronically Signed   By: Richarda Overlie M.D.   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 Patient with development of new  extensive left lower extremity venous thrombosis as such, patient underwent initiation of catheter directed thrombolysis performed 04/10/2016. Patient returns in interventional radiology department for venous lysis completion evaluation and intervention. EXAM: 1. IR THROMB F/U EVAL ART/VEN FINAL DAY; 2. FLUOROSCOPIC GUIDED THROMBECTOMY MECHANICAL VENOUS COMPARISON:  CT of the abdomen pelvis - 04/08/2016;  initiation of left lower extremity catheter directed thrombolysis - 04/10/2016; subsequent catheter directed thrombolysis - 04/11/2016 MEDICATIONS: None. ANESTHESIA/SEDATION: Moderate (conscious) sedation was employed during this procedure. A total of Fentanyl 100 mcg was administered intravenously. Moderate Sedation Time: 20 minutes. The patient's level of consciousness and vital signs were monitored continuously by radiology nursing throughout the procedure under my direct supervision. FLUOROSCOPY TIME:  Fluoroscopy Time: 3 minutes 48 seconds (129 mGy). COMPLICATIONS: None immediate. TECHNIQUE: Informed written consent was obtained from the patient after a thorough discussion of the procedural risks, benefits and alternatives. All questions were addressed. Maximal Sterile Barrier Technique was utilized including caps, mask, sterile gowns, sterile gloves, sterile drape, hand hygiene and skin antiseptic. A timeout was performed prior to the initiation of the procedure. The external portion of the existing left popliteal approach venous catheter as well as external portion of the infusion catheter were prepped and draped in usual sterile fashion. The wire of the infusion catheter was removed and a central left lower extremity venogram was performed. A thigh venogram was performed through the side arm of the popliteal vascular sheath. Over a Rosen wire, the infusion catheter was exchanged for a vertebral catheter and a pelvic venogram was performed. Next, 2 rounds of mechanical thrombectomy was performed throughout the left external common and superficial femoral veins with the use of an Angiojet device. Completion venogram images were obtained in the procedure was terminated. All wires catheters and sheaths were removed from the patient. Hemostasis was achieved at the left popliteal access site with manual compression. A dressing was placed. The patient tolerated the procedure well without immediate postprocedural  complication. FINDINGS: Initial left lower extremity venogram demonstrates restored patency with antegrade flow throughout the left femoral vein. A moderate to large amount of nonocclusive thrombus within the left external iliac and common femoral vein was treated with 2 rounds of mechanical thrombectomy with the use of an Angiojet device. Completion pelvic venogram demonstrates restored antegrade flow through the pelvis with filling of multiple hypertrophied paraspinal venous collaterals with eventual opacification of the perirenal IVC at the level of the L3 vertebral body. A definitive infrarenal IVC was not identified. IMPRESSION: 1. Technically successful 2 day catheter directed thrombolysis of extensive left lower extremity DVT with restoration of antegrade flow throughout the left lower extremity to the level of hypertrophied paraspinal venous collaterals. 2. The perirenal IVC identified at the level of the L3 vertebral body. 3. An infrarenal IVC is not identified compatible with the findings suspected on preceding abdominal CT. PLAN: - The patient will be maintained on IV heparin until he is a transitioned to an appropriate outpatient anticoagulant as per the primary clinical team. Unfortunately, given the extent of dizziness disease, the patient will likely be maintained on anti coagulation indefinitely. - Recommend patient wear bilateral thigh high venous compression hoses at least for the duration of the summer. - Patient will be seen in follow-up consultation by initiating interventional radiologist, Dr. Deanne Coffer in approximately 3-4 weeks with repeat bilateral lower extremity venous Doppler ultrasound. Electronically Signed   By: Simonne Come M.D.   On: 04/12/2016 17:52    Microbiology: Recent Results (from the past 240 hour(s))  MRSA  PCR Screening     Status: None   Collection Time: 04/10/16 11:13 AM  Result Value Ref Range Status   MRSA by PCR NEGATIVE NEGATIVE Final    Comment:        The  GeneXpert MRSA Assay (FDA approved for NASAL specimens only), is one component of a comprehensive MRSA colonization surveillance program. It is not intended to diagnose MRSA infection nor to guide or monitor treatment for MRSA infections.      Labs: CBC:  Recent Labs Lab 04/08/16 2035  04/10/16 0559  04/12/16 0130 04/12/16 1438 04/13/16 0413 04/13/16 0910 04/14/16 0645  WBC 9.6  < > 8.8  < > 9.2 8.4 7.9 7.3 6.9  NEUTROABS 8.3*  --  5.8  --   --   --   --   --   --   HGB 12.6*  < > 11.6*  < > 10.8* 10.0* 10.9* 11.0* 12.1*  HCT 36.1*  < > 34.8*  < > 33.8* 31.6* 34.9* 34.9* 38.6*  MCV 77.1*  < > 79.5  < > 79.7 79.4 80.2 79.3 80.6  PLT 356  < > 430*  < > 282 284 253 343 427*  < > = values in this interval not displayed. Basic Metabolic Panel:  Recent Labs Lab 04/10/16 0559 04/11/16 0350 04/12/16 0611 04/13/16 0910 04/14/16 0645  NA 137 137 139 140 140  K 3.6 3.6 3.8 3.2* 4.3  CL 97* 101 103 104 103  CO2 25 27 29 28 25   GLUCOSE 97 111* 111* 94 70  BUN <5* <5* <5* <5* <5*  CREATININE 0.89 0.84 0.89 0.83 0.92  CALCIUM 8.7* 8.0* 8.3* 8.2* 9.1   Time spent: 30 minutes  Signed:  Diana Armijo  [facility]  04/14/2016  , 2:50 PM

## 2016-04-15 ENCOUNTER — Other Ambulatory Visit (HOSPITAL_COMMUNITY): Payer: Self-pay | Admitting: Radiology

## 2016-04-15 ENCOUNTER — Other Ambulatory Visit (HOSPITAL_COMMUNITY): Payer: Self-pay | Admitting: Interventional Radiology

## 2016-04-15 DIAGNOSIS — I82402 Acute embolism and thrombosis of unspecified deep veins of left lower extremity: Secondary | ICD-10-CM

## 2016-04-18 ENCOUNTER — Other Ambulatory Visit (HOSPITAL_COMMUNITY): Payer: Self-pay | Admitting: Interventional Radiology

## 2016-04-18 DIAGNOSIS — I82401 Acute embolism and thrombosis of unspecified deep veins of right lower extremity: Secondary | ICD-10-CM

## 2016-04-18 DIAGNOSIS — I82402 Acute embolism and thrombosis of unspecified deep veins of left lower extremity: Secondary | ICD-10-CM

## 2016-05-07 ENCOUNTER — Other Ambulatory Visit: Payer: Medicaid Other

## 2016-05-07 ENCOUNTER — Ambulatory Visit
Admission: RE | Admit: 2016-05-07 | Discharge: 2016-05-07 | Disposition: A | Discharge status: Home / Self Care | Payer: Medicaid Other | Source: Ambulatory Visit | Attending: Radiology | Admitting: Radiology

## 2016-05-07 ENCOUNTER — Ambulatory Visit
Admission: RE | Admit: 2016-05-07 | Discharge: 2016-05-07 | Disposition: A | Discharge status: Home / Self Care | Payer: Medicaid Other | Source: Ambulatory Visit | Attending: Interventional Radiology | Admitting: Interventional Radiology

## 2016-05-07 DIAGNOSIS — I82401 Acute embolism and thrombosis of unspecified deep veins of right lower extremity: Secondary | ICD-10-CM

## 2016-05-07 DIAGNOSIS — I82402 Acute embolism and thrombosis of unspecified deep veins of left lower extremity: Secondary | ICD-10-CM

## 2016-05-07 NOTE — Progress Notes (Signed)
Patient ID: Mark Orozco, male   DOB: Oct 02, 1986, 30 y.o.   MRN: [id number]       Chief Complaint: Patient was seen in consultation today for  Chief Complaint  Patient presents with  . Follow-up   at the request of Turpin,Pamela  Referring Physician(s): Turpin,Pamela  History of Present Illness: Mark Orozco is a 30 y.o. 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 He had presented with an acute left lower extremity DVT 04/10/2016 and underwent catheter directed lysis.  He is doing very well right now. He is back to his baseline. He's very happy with his results. He is on xarelto, and plans to stay on it for the foreseeable future.  Past Medical History  Diagnosis Date  . DVT (deep venous thrombosis) (HCC)   . Schizoaffective disorder, bipolar type (HCC)   . Crohn disease (HCC)   . Extreme prematurity     born at [redacted]wks gestation    No past surgical history on file.  Allergies: Ibuprofen and Tylenol  Medications: Prior to Admission medications   Medication Sig Start Date End Date Taking? Authorizing Provider  benztropine (COGENTIN) 0.5 MG tablet Take 0.5 mg by mouth 2 (two) times daily.   Yes Historical Provider, MD  FAMOTIDINE PO Take 20 mg by mouth 2 (two) times daily.    Yes Historical Provider, MD  fluPHENAZine (PROLIXIN) 5 MG tablet Take 5 mg by mouth 2 (two) times daily.   Yes Historical Provider, MD  mesalamine (LIALDA) 1.2 g EC tablet Take 4.8 g by mouth daily with breakfast.    Yes Historical Provider, MD  mirtazapine (REMERON) 30 MG tablet Take 30 mg by mouth at bedtime.   Yes Historical Provider, MD  Oxcarbazepine (TRILEPTAL) 300 MG tablet Take 300 mg by mouth 2 (two) times daily.   Yes Historical Provider, MD  Paliperidone Palmitate (INVEGA SUSTENNA IM) Inject 156 mg into the muscle every 30 (thirty) days.    Yes  Historical Provider, MD  Rivaroxaban 15 & 20 MG TBPK Take as directed on package: Start with one 15mg  tablet by mouth twice a day with food. On Day 22, switch to one 20mg  tablet once a day with food. 04/14/16  Yes Rolly Salter, MD  traMADol (ULTRAM) 50 MG tablet Take 1 tablet (50 mg total) by mouth every 6 (six) hours as needed. 04/14/16  Yes Rolly Salter, MD  Vilazodone HCl (VIIBRYD) 20 MG TABS Take 20 mg by mouth daily.   Yes Historical Provider, MD  BACLOFEN PO Take 20 mg by mouth every 8 (eight) hours as needed (muscle spasms). Reported on 05/07/2016    Historical Provider, MD  clopidogrel (PLAVIX) 75 MG tablet Take 75 mg by mouth daily. Reported on 05/07/2016    Historical Provider, MD  DIAZEPAM PO Take 5 mg by mouth 4 (four) times daily. Reported on 05/07/2016    Historical Provider, MD  OXYCODONE HCL PO Take by mouth. Reported on 05/07/2016    Historical Provider, MD  polyethylene glycol (MIRALAX / GLYCOLAX) packet Take 17 g by mouth daily as needed for mild constipation. Patient not taking: Reported on 05/07/2016 04/14/16   Rolly Salter, MD     Family History  Problem Relation Age of Onset  . Diabetes Mother   . Diabetes Maternal Grandmother     Social History   Social History  . Marital Status: Single  Spouse Name: N/A  . Number of Children: N/A  . Years of Education: N/A   Social History Main Topics  . Smoking status: Current Every Day Smoker  . Smokeless tobacco: Not on file  . Alcohol Use: Yes     Comment: social  . Drug Use: No  . Sexual Activity: Not on file   Other Topics Concern  . Not on file   Social History Narrative    ECOG Status: 1 - Symptomatic but completely ambulatory  Review of Systems: A 12 point ROS discussed and pertinent positives are indicated in the HPI above.  All other systems are negative.  Review of Systems  Vital Signs: BP 140/87 mmHg  Pulse 66  Temp(Src) 97.4 F (36.3 C) (Oral)  Resp 14  SpO2 100%  Physical  Exam  Mallampati Score:     Imaging: Ir Veno/ext/uni Left  04/10/2016  CLINICAL DATA:  History of multiple femoral venous catheterization such as a premature neonate. History of right lower extremity venous thrombosis. 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 MEDICATIONS: Lidocaine 1% subcutaneous CONTRAST:  20mL ISOVUE-300 IOPAMIDOL (ISOVUE-300) INJECTION 61% PROCEDURE: The procedure, risks (including but not limited to bleeding, infection, organ damage ), benefits, and alternatives were explained to the patient and family. Questions regarding the procedure were encouraged and answered. The patient understands and consents to the procedure. Patient placed prone on the procedure table. Left leg prepped and draped in usual sterile fashion. Maximal barrier sterile technique was utilized including caps, mask, sterile gowns, sterile gloves, sterile drape, hand hygiene and skin antiseptic. After time-out, moderate sedation was initiated. After the skin was infiltrated with lidocaine, the left posterior tibial vein was accessed under ultrasound guidance with a 21 gauge micropuncture set at the proximal calf level. Venography was performed. The micropuncture dilator was exchanged for a 6 [place] vascular sheath, through which a 5 French angled angiographic catheter was advanced for left lower extremity venography. The catheter was exchanged over a guidewire for the 6 French EKOS infusion device, placed from the popliteal  vein to the left common iliac vein. Catheter and sheath were secured externally and catheter directed ultrasound assisted thrombolytic infusion was initiated. Patient transferred to ICU for observation. The patient tolerated the procedure well. COMPLICATIONS: None immediate FINDINGS: Initial left lower extremity venography demonstrated occlusive DVT extending from posterior tibial veins through the popliteal, femoral, common femoral, external iliac, and common iliac veins. Direct communication to the IVC was not demonstrated, consistent with recent CT findings. An infusion catheters placed from the popliteal vein to the left common iliac vein and ultrasound assisted venous thrombo lysis was initiated. IMPRESSION: 1. Extensive occlusive acute thrombus through the posterior tibial, popliteal, femoral, common femoral, left external and common iliac veins. 2. 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 3. Initiation of catheter directed ultrasound assisted thrombolysis of the extensive left lower extremity and pelvic DVT as above. Follow-up venography planned in the morning. Electronically Signed   By: Corlis Leak M.D.   On: 04/10/2016 12:49   Ir Thrombect Veno Mech  Mod Sed  04/11/2016  INDICATION: 30 year old with left lower extremity DVT. One day follow-up of catheter directed thrombolytic therapy. EXAM: FOLLOW-UP OF CATHETER DIRECTED THROMBOLYTIC THERAPY - SUBSEQUENT DAY TRANSCATHETER MECHANICAL THROMBECTOMY VENOUS BALLOON ANGIOPLASTY COMPARISON:  None. MEDICATIONS: None. ANESTHESIA/SEDATION: Versed 1 mg IV; Fentanyl 12.5 mcg IV Moderate Sedation Time:  60 minutes The patient was continuously monitored during the procedure by the interventional radiology nurse under my direct supervision. FLUOROSCOPY TIME:  Fluoroscopy Time: 7 minutes 30 seconds (66 mGy). COMPLICATIONS: None immediate. TECHNIQUE: Patient was placed prone on the interventional  table. The existing catheter and the back of the left leg were prepped and draped in a sterile fashion. Maximal barrier sterile technique was utilized including caps, mask, sterile gowns, sterile gloves, sterile drape, hand hygiene and skin antiseptic. Contrast was injected through the 6 French sheath side port. Left lower extremity venogram was performed. The EKOS catheter was removed and a 5 French catheter was advanced into the upper leg and additional venography was performed. A Rosen wire was placed. The iliac veins and upper femoral vein underwent mechanical thrombectomy with the Angiojet device. Follow-up venography was performed. The upper femoral vein and common femoral vein were treated with 6 mm x 40 mm balloon. Follow-up venography was performed. A ew 90 cm infusion catheter was placed. This catheter has 40 cm of infusion length. Tip of the catheter was placed in the left iliac veins. Catheter directed thrombolysis was continued using tenecteplase. FINDINGS: There was improved flow in the left femoral vein. There was persistent occlusion in the upper femoral vein and the iliac veins. Patent collateral veins in the upper pelvis that appear to be draining through the lumbar venous system. 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 Following thrombectomy and balloon angioplasty, there was flow throughout the left femoral vein with some residual nonocclusive clot. However, there continued to be a large clot burden in the common femoral vein and external iliac vein. Therefore, the infusion catheter was placed. IMPRESSION: Improved venous flow in the left lower extremity and left iliac veins following catheter directed thrombolysis, mechanical thrombectomy and balloon angioplasty. There continues to be a large clot burden in the pelvis. Therefore, will continue catheter directed thrombolysis for at least 1 more day. Electronically Signed    By: Richarda Overlie M.D.   On: 04/11/2016 17:50   Ir Thrombect Veno Mech Rept Mod Sed  04/12/2016  INDICATION: History of multiple prior femoral venous catheterizations as a premature neonate. 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 Patient with development of new extensive left lower extremity venous thrombosis as such, patient underwent initiation of catheter directed thrombolysis performed 04/10/2016. Patient returns in interventional radiology department for venous lysis completion evaluation and intervention. EXAM: 1. IR THROMB F/U EVAL ART/VEN FINAL DAY; 2. FLUOROSCOPIC GUIDED THROMBECTOMY MECHANICAL VENOUS COMPARISON:  CT of the abdomen pelvis - 04/08/2016; initiation of left lower extremity catheter directed thrombolysis - 04/10/2016; subsequent catheter directed thrombolysis - 04/11/2016 MEDICATIONS: None. ANESTHESIA/SEDATION: Moderate (conscious) sedation was employed during this procedure. A total of Fentanyl 100 mcg was administered intravenously. Moderate Sedation Time: 20 minutes. The patient's level of consciousness and vital signs were monitored continuously by radiology nursing throughout the procedure under my direct supervision. FLUOROSCOPY TIME:  Fluoroscopy Time: 3 minutes 48 seconds (129 mGy). COMPLICATIONS: None immediate. TECHNIQUE: Informed written consent was obtained from the patient after a thorough discussion of  the procedural risks, benefits and alternatives. All questions were addressed. Maximal Sterile Barrier Technique was utilized including caps, mask, sterile gowns, sterile gloves, sterile drape, hand hygiene and skin antiseptic. A timeout was performed prior to the initiation of the procedure. The external portion of the existing left popliteal approach venous catheter as well as external portion of the infusion catheter were prepped and draped in usual sterile fashion. The wire of the infusion catheter was removed and  a central left lower extremity venogram was performed. A thigh venogram was performed through the side arm of the popliteal vascular sheath. Over a Rosen wire, the infusion catheter was exchanged for a vertebral catheter and a pelvic venogram was performed. Next, 2 rounds of mechanical thrombectomy was performed throughout the left external common and superficial femoral veins with the use of an Angiojet device. Completion venogram images were obtained in the procedure was terminated. All wires catheters and sheaths were removed from the patient. Hemostasis was achieved at the left popliteal access site with manual compression. A dressing was placed. The patient tolerated the procedure well without immediate postprocedural complication. FINDINGS: Initial left lower extremity venogram demonstrates restored patency with antegrade flow throughout the left femoral vein. A moderate to large amount of nonocclusive thrombus within the left external iliac and common femoral vein was treated with 2 rounds of mechanical thrombectomy with the use of an Angiojet device. Completion pelvic venogram demonstrates restored antegrade flow through the pelvis with filling of multiple hypertrophied paraspinal venous collaterals with eventual opacification of the perirenal IVC at the level of the L3 vertebral body. A definitive infrarenal IVC was not identified. IMPRESSION: 1. Technically successful 2 day catheter directed thrombolysis of extensive left lower extremity DVT with restoration of antegrade flow throughout the left lower extremity to the level of hypertrophied paraspinal venous collaterals. 2. The perirenal IVC identified at the level of the L3 vertebral body. 3. An infrarenal IVC is not identified compatible with the findings suspected on preceding abdominal CT. PLAN: - The patient will be maintained on IV heparin until he is a transitioned to an appropriate outpatient anticoagulant as per the primary clinical team.  Unfortunately, given the extent of dizziness disease, the patient will likely be maintained on anti coagulation indefinitely. - Recommend patient wear bilateral thigh high venous compression hoses at least for the duration of the summer. - Patient will be seen in follow-up consultation by initiating interventional radiologist, Dr. Deanne Coffer in approximately 3-4 weeks with repeat bilateral lower extremity venous Doppler ultrasound. Electronically Signed   By: Simonne Come M.D.   On: 04/12/2016 17:52   Ir US Guide Vasc Access Left  04/10/2016  CLINICAL DATA:  History of multiple femoral venous catheterization such as a premature neonate. History of right lower extremity venous thrombosis. 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 MEDICATIONS: Lidocaine 1% subcutaneous CONTRAST:  20mL ISOVUE-300 IOPAMIDOL (ISOVUE-300) INJECTION 61% PROCEDURE: The procedure, risks (including but not limited to bleeding, infection, organ damage ), benefits, and alternatives were explained to the patient and family. Questions regarding the procedure were encouraged and answered. The patient understands and consents to the  procedure. Patient placed prone on the procedure table. Left leg prepped and draped in usual sterile fashion. Maximal barrier sterile technique was utilized including caps, mask, sterile gowns, sterile gloves, sterile drape, hand hygiene and skin antiseptic. After time-out, moderate sedation was initiated. After the skin was  infiltrated with lidocaine, the left posterior tibial vein was accessed under ultrasound guidance with a 21 gauge micropuncture set at the proximal calf level. Venography was performed. The micropuncture dilator was exchanged for a 6 [place] vascular sheath, through which a 5 French angled angiographic catheter was advanced for left lower extremity venography. The catheter was exchanged over a guidewire for the 6 French EKOS infusion device, placed from the popliteal vein to the left common iliac vein. Catheter and sheath were secured externally and catheter directed ultrasound assisted thrombolytic infusion was initiated. Patient transferred to ICU for observation. The patient tolerated the procedure well. COMPLICATIONS: None immediate FINDINGS: Initial left lower extremity venography demonstrated occlusive DVT extending from posterior tibial veins through the popliteal, femoral, common femoral, external iliac, and common iliac veins. Direct communication to the IVC was not demonstrated, consistent with recent CT findings. An infusion catheters placed from the popliteal vein to the left common iliac vein and ultrasound assisted venous thrombo lysis was initiated. IMPRESSION: 1. Extensive occlusive acute thrombus through the posterior tibial, popliteal, femoral, common femoral, left external and common iliac veins. 2. 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 3. Initiation of catheter directed ultrasound assisted thrombolysis of the extensive left lower extremity and pelvic DVT as above. Follow-up venography planned in the morning. Electronically Signed   By: Corlis Leak M.D.   On: 04/10/2016 12:49   Ir Pta Venous Except Dialysis Circuit  04/11/2016  INDICATION: 30 year old with left lower extremity DVT. One day follow-up of catheter directed thrombolytic therapy. EXAM: FOLLOW-UP OF CATHETER DIRECTED THROMBOLYTIC THERAPY - SUBSEQUENT DAY  TRANSCATHETER MECHANICAL THROMBECTOMY VENOUS BALLOON ANGIOPLASTY COMPARISON:  None. MEDICATIONS: None. ANESTHESIA/SEDATION: Versed 1 mg IV; Fentanyl 12.5 mcg IV Moderate Sedation Time:  60 minutes The patient was continuously monitored during the procedure by the interventional radiology nurse under my direct supervision. FLUOROSCOPY TIME:  Fluoroscopy Time: 7 minutes 30 seconds (66 mGy). COMPLICATIONS: None immediate. TECHNIQUE: Patient was placed prone on the interventional table. The existing catheter and the back of the left leg were prepped and draped in a sterile fashion. Maximal barrier sterile technique was utilized including caps, mask, sterile gowns, sterile gloves, sterile drape, hand hygiene and skin antiseptic. Contrast was injected through the 6 French sheath side port. Left lower extremity venogram was performed. The EKOS catheter was removed and a 5 French catheter was advanced into the upper leg and additional venography was performed. A Rosen wire was placed. The iliac veins and upper femoral vein underwent mechanical thrombectomy with the Angiojet device. Follow-up venography was performed. The upper femoral vein and common femoral vein were treated with 6 mm x 40 mm balloon. Follow-up venography was performed. A ew 90 cm infusion catheter was placed. This catheter has 40 cm of infusion length. Tip of the catheter was placed in the left iliac veins. Catheter directed thrombolysis was continued using tenecteplase. FINDINGS: There was improved flow in the left femoral vein. There was persistent occlusion in the upper femoral vein and the iliac veins. Patent collateral veins in the upper pelvis that appear to be draining through the lumbar venous system. 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 Following thrombectomy and balloon angioplasty, there was flow throughout the left femoral vein with some residual nonocclusive clot.  However, there continued to be a large clot burden in the common femoral vein and external iliac vein. Therefore, the infusion catheter was placed. IMPRESSION: Improved venous flow in the left lower extremity and left iliac veins following catheter directed thrombolysis, mechanical thrombectomy and balloon angioplasty. There continues to be a large clot burden in the pelvis. Therefore, will continue catheter directed thrombolysis for at least 1 more day. Electronically Signed   By: Richarda Overlie M.D.   On: 04/11/2016 17:50   Ct Venogram Abd/pel  04/08/2016  CLINICAL DATA:  Patient was diagnosed with deep venous thrombosis today in the left leg. Back pain. EXAM: CT venogram of  the abdomen, pelvis, and lower extremities TECHNIQUE: Axial CT images of the abdomen, pelvis, and bilateral lower extremities from hip to below the knee are obtained with contrast material during venous phase. Additional sagittal and coronal multiplanar reformatted images and maximal intensity projection images are obtained. CONTRAST:  100 mL Isovue 370 COMPARISON:  None. FINDINGS: CT venogram: There is dilatation and filling defect consistent with deep venous thrombosis involving the left tibioperoneal trunk, left popliteal vein, left deep femoral vein, left common femoral vein, left external iliac vein, and left internal iliac vein. Left deep femoral vein is also involved. The inferior vena cava and iliac veins are somewhat flattened appear patent. Right pelvic and right leg veins are patent. Left leg veins demonstrate infiltration in the fat around the venous structures possibly due to obstruction or possibly indicating infection. Thrombophlebitis is not excluded. Mild diffuse soft tissue edema in the left leg. CT abdomen and pelvis: Lung bases are clear. The liver, spleen, gallbladder, pancreas, adrenal glands, kidneys, abdominal aorta, and retroperitoneal lymph nodes are unremarkable. Stomach, small bowel, and colon are not abnormally  distended. No free air or free fluid in the abdomen. Pelvis: Bladder wall is not thickened. No pelvic mass or lymphadenopathy. No free or loculated pelvic fluid collections. Prostate gland is not enlarged. Edema in the soft tissues with superficial S collaterals. Scattered lymph nodes in the pelvis are not abnormally distended. Appendix is normal. No destructive bone lesions. IMPRESSION: Deep venous thrombosis demonstrated involving the left pelvic veins and deep venous system of the left leg, extending from the left external iliac, internal iliac, common femoral, superficial and deep femoral, popliteal, and tibial peroneal veins. Associated edema and collateral venous structures demonstrated. Infiltration around the deep veins of the left leg may indicate edema or phlebitis. Electronically Signed   By: Burman Nieves M.D.   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 History of right lower extremity venous thrombosis. 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 MEDICATIONS: Lidocaine 1% subcutaneous CONTRAST:  20mL ISOVUE-300 IOPAMIDOL (ISOVUE-300) INJECTION 61% PROCEDURE: The procedure, risks (including but not limited to bleeding, infection, organ damage ), benefits, and alternatives were explained to the patient  and family. Questions regarding the procedure were encouraged and answered. The  patient understands and consents to the procedure. Patient placed prone on the procedure table. Left leg prepped and draped in usual sterile fashion. Maximal barrier sterile technique was utilized including caps, mask, sterile gowns, sterile gloves, sterile drape, hand hygiene and skin antiseptic. After time-out, moderate sedation was initiated. After the skin was infiltrated with lidocaine, the left posterior tibial vein was accessed under ultrasound guidance with a 21 gauge micropuncture set at the proximal calf level. Venography was performed. The micropuncture dilator was exchanged for a 6 [place] vascular sheath, through which a 5 French angled angiographic catheter was advanced for left lower extremity venography. The catheter was exchanged over a guidewire for the 6 French EKOS infusion device, placed from the popliteal vein to the left common iliac vein. Catheter and sheath were secured externally and catheter directed ultrasound assisted thrombolytic infusion was initiated. Patient transferred to ICU for observation. The patient tolerated the procedure well. COMPLICATIONS: None immediate FINDINGS: Initial left lower extremity venography demonstrated occlusive DVT extending from posterior tibial veins through the popliteal, femoral, common femoral, external iliac, and common iliac veins. Direct communication to the IVC was not demonstrated, consistent with recent CT findings. An infusion catheters placed from the popliteal vein to the left common iliac vein and ultrasound assisted venous thrombo lysis was initiated. IMPRESSION: 1. Extensive occlusive acute thrombus through the posterior tibial, popliteal, femoral, common femoral, left external and common iliac veins. 2. 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 3. Initiation of catheter directed ultrasound assisted thrombolysis of the extensive left lower extremity and pelvic DVT as  above. Follow-up venography planned in the morning. Electronically Signed   By: Corlis Leak M.D.   On: 04/10/2016 12:49   Ir Rande Lawman F/u Eval Art/ven Basil Dess Day (ms)  04/11/2016  INDICATION: 30 year old with left lower extremity DVT. One day follow-up of catheter directed thrombolytic therapy. EXAM: FOLLOW-UP OF CATHETER DIRECTED THROMBOLYTIC THERAPY - SUBSEQUENT DAY TRANSCATHETER MECHANICAL THROMBECTOMY VENOUS BALLOON ANGIOPLASTY COMPARISON:  None. MEDICATIONS: None. ANESTHESIA/SEDATION: Versed 1 mg IV; Fentanyl 12.5 mcg IV Moderate Sedation Time:  60 minutes The patient was continuously monitored during the procedure by the interventional radiology nurse under my direct supervision. FLUOROSCOPY TIME:  Fluoroscopy Time: 7 minutes 30 seconds (66 mGy). COMPLICATIONS: None immediate. TECHNIQUE: Patient was placed prone on the interventional table. The existing catheter and the back of the left leg were prepped and draped in a sterile fashion. Maximal barrier sterile technique was utilized including caps, mask, sterile gowns, sterile gloves, sterile drape, hand hygiene and skin antiseptic. Contrast was injected through the 6 French sheath side port. Left lower extremity venogram was performed. The EKOS catheter was removed and a 5 French catheter was advanced into the upper leg and additional venography was performed. A Rosen wire was placed. The iliac veins and upper femoral vein underwent mechanical thrombectomy with the Angiojet device. Follow-up venography was performed. The upper femoral vein and common femoral vein were treated with 6 mm x 40 mm balloon. Follow-up venography was performed. A ew 90 cm infusion catheter was placed. This catheter has 40 cm of infusion length. Tip of the catheter was placed in the left iliac veins. Catheter directed thrombolysis was continued using tenecteplase. FINDINGS: There was improved flow in the left femoral vein. There was persistent occlusion in the upper femoral vein and  the iliac veins. Patent collateral veins in the upper  pelvis that appear to be draining through the lumbar venous system. 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 Following thrombectomy and balloon angioplasty, there was flow throughout the left femoral vein with some residual nonocclusive clot. However, there continued to be a large clot burden in the common femoral vein and external iliac vein. Therefore, the infusion catheter was placed. IMPRESSION: Improved venous flow in the left lower extremity and left iliac veins following catheter directed thrombolysis, mechanical thrombectomy and balloon angioplasty. There continues to be a large clot burden in the pelvis. Therefore, will continue catheter directed thrombolysis for at least 1 more day. Electronically Signed   By: Richarda Overlie M.D.   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 Patient with development of new extensive left lower extremity venous thrombosis as such, patient underwent initiation of catheter directed thrombolysis performed 04/10/2016. Patient returns in interventional radiology department for venous lysis completion evaluation and intervention. EXAM: 1. IR THROMB F/U EVAL ART/VEN FINAL DAY; 2. FLUOROSCOPIC GUIDED THROMBECTOMY MECHANICAL VENOUS COMPARISON:  CT of the abdomen pelvis - 04/08/2016; initiation of left lower extremity catheter directed thrombolysis - 04/10/2016; subsequent catheter directed thrombolysis - 04/11/2016 MEDICATIONS: None. ANESTHESIA/SEDATION: Moderate (conscious) sedation was employed during this procedure. A total of Fentanyl 100 mcg was administered intravenously. Moderate Sedation Time: 20 minutes. The patient's  level of consciousness and vital signs were monitored continuously by radiology nursing throughout the procedure under my direct supervision. FLUOROSCOPY TIME:  Fluoroscopy Time: 3 minutes 48 seconds (129 mGy). COMPLICATIONS: None immediate. TECHNIQUE: Informed written consent was obtained from the patient after a thorough discussion of the procedural risks, benefits and alternatives. All questions were addressed. Maximal Sterile Barrier Technique was utilized including caps, mask, sterile gowns, sterile gloves, sterile drape, hand hygiene and skin antiseptic. A timeout was performed prior to the initiation of the procedure. The external portion of the existing left popliteal approach venous catheter as well as external portion of the infusion catheter were prepped and draped in usual sterile fashion. The wire of the infusion catheter was removed and a central left lower extremity venogram was performed. A thigh venogram was performed through the side arm of the popliteal vascular sheath. Over a Rosen wire, the infusion catheter was exchanged for a vertebral catheter and a pelvic venogram was performed. Next, 2 rounds of mechanical thrombectomy was performed throughout the left external common and superficial femoral veins with the use of an Angiojet device. Completion venogram images were obtained in the procedure was terminated. All wires catheters and sheaths were removed from the patient. Hemostasis was achieved at the left popliteal access site with manual compression. A dressing was placed. The patient tolerated the procedure well without immediate postprocedural complication. FINDINGS: Initial left lower extremity venogram demonstrates restored patency with antegrade flow throughout the left femoral vein. A moderate to large amount of nonocclusive thrombus within the left external iliac and common femoral vein was treated with 2 rounds of mechanical thrombectomy with the use of an Angiojet device. Completion  pelvic venogram demonstrates restored antegrade flow through the pelvis with filling of multiple hypertrophied paraspinal venous collaterals with eventual opacification of the perirenal IVC at the level of the L3 vertebral body. A definitive infrarenal IVC was not  identified. IMPRESSION: 1. Technically successful 2 day catheter directed thrombolysis of extensive left lower extremity DVT with restoration of antegrade flow throughout the left lower extremity to the level of hypertrophied paraspinal venous collaterals. 2. The perirenal IVC identified at the level of the L3 vertebral body. 3. An infrarenal IVC is not identified compatible with the findings suspected on preceding abdominal CT. PLAN: - The patient will be maintained on IV heparin until he is a transitioned to an appropriate outpatient anticoagulant as per the primary clinical team. Unfortunately, given the extent of dizziness disease, the patient will likely be maintained on anti coagulation indefinitely. - Recommend patient wear bilateral thigh high venous compression hoses at least for the duration of the summer. - Patient will be seen in follow-up consultation by initiating interventional radiologist, Dr. Deanne Coffer in approximately 3-4 weeks with repeat bilateral lower extremity venous Doppler ultrasound. Electronically Signed   By: Simonne Come M.D.   On: 04/12/2016 17:52    Labs:  CBC:  Recent Labs  04/12/16 1438 04/13/16 0413 04/13/16 0910 04/14/16 0645  WBC 8.4 7.9 7.3 6.9  HGB 10.0* 10.9* 11.0* 12.1*  HCT 31.6* 34.9* 34.9* 38.6*  PLT 284 253 343 427*    COAGS:  Recent Labs  04/09/16 1558  INR 1.17  APTT 34    BMP:  Recent Labs  04/11/16 0350 04/12/16 0611 04/13/16 0910 04/14/16 0645  NA 137 139 140 140  K 3.6 3.8 3.2* 4.3  CL 101 103 104 103  CO2 27 29 28 25   GLUCOSE 111* 111* 94 70  BUN <5* <5* <5* <5*  CALCIUM 8.0* 8.3* 8.2* 9.1  CREATININE 0.84 0.89 0.83 0.92  GFRNONAA >60 >60 >60 >60  GFRAA >60 >60 >60  >60    LIVER FUNCTION TESTS: No results for input(s): BILITOT, AST, ALT, ALKPHOS, PROT, ALBUMIN in the last 8760 hours.  TUMOR MARKERS: No results for input(s): AFPTM, CEA, CA199, CHROMGRNA in the last 8760 hours.  Assessment and Plan:  My impression is that the patient has done very well post catheter directed lysis of acute left lower extremity DVT. We reviewed the pathophysiology of venous thromboembolism. 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 We reviewed the lifelong risk of recurrent DVT and lower extremity swelling. He understands the importance of continued anticoagulation as tolerated. He's currently using his thigh high compression hose which I would recommend using as needed during waking hours. I reviewed his imaging with Dr. Loreta Ave. 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 Should he have bouts of continued lower extremity DVT despite adequate anticoagulation, or severe swelling secondary to inadequate collateral drainage, such intervention may be warranted in the future.  Thank you for this interesting consult.  I greatly enjoyed meeting Mark Orozco and look forward to participating in their care.  A copy of this report was sent to the requesting provider on this date.  Electronically Signed: Emmanuel Ercole III, DAYNE Zacchary Pompei 05/07/2016, 4:42 PM   I spent a total of    25 Minutes in face to face in clinical consultation, greater than 50% of which was counseling/coordinating care for left lower extremity DVT, post lysis.

## 2017-06-13 IMAGING — CT CT VENOGRAM ABD-PELV
2 of 6 series · 12 of 46 positions shown, 14 images · IV contrast (APPLIED)
Comparison: None.

CLINICAL DATA: Patient was diagnosed with deep venous thrombosis
today in the left leg. Back pain.

EXAM:
CT venogram of  the abdomen, pelvis, and lower extremities
TECHNIQUE: Axial CT images of the abdomen, pelvis, and bilateral lower
extremities from hip to below the knee are obtained with contrast
material during venous phase. Additional sagittal and coronal
multiplanar reformatted images and maximal intensity projection
images are obtained.
CONTRAST:  100 mL Isovue 370

[Series 2: axial venous · axial · portal-venous · 0.88mm/px · z∈[-1023,-153]mm · 9 of 206 slices shown, 11 images]
[im 16/206  soft-tissue]
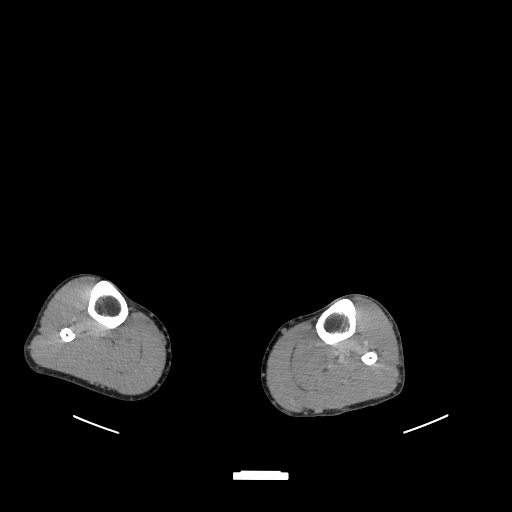
[im 16/206  bone]
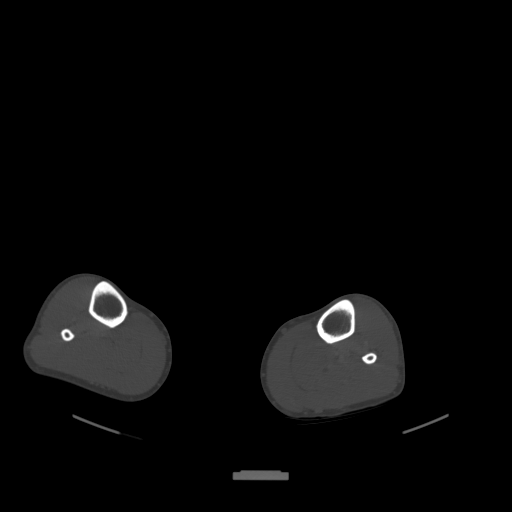
[im 38/206  soft-tissue]
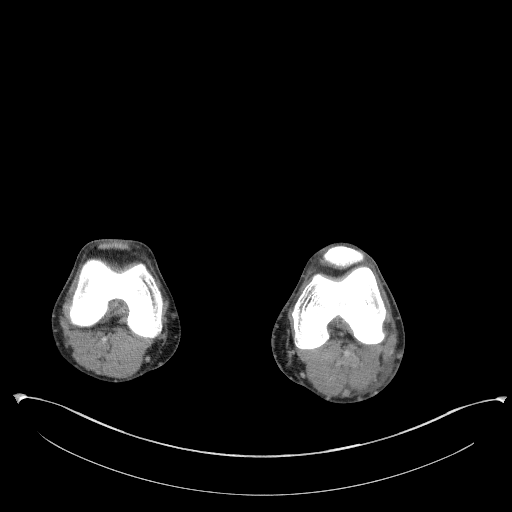
[im 61/206  soft-tissue]
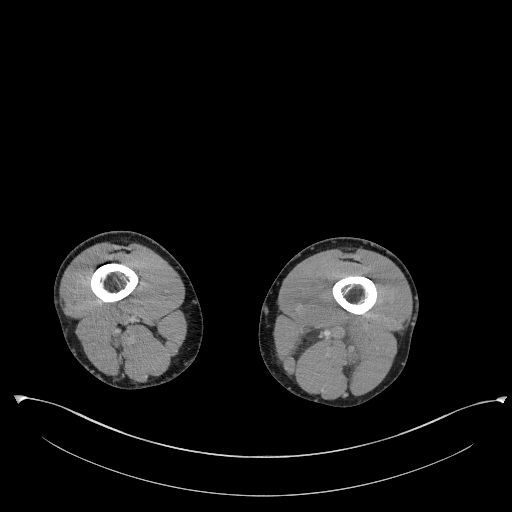
[im 84/206  soft-tissue]
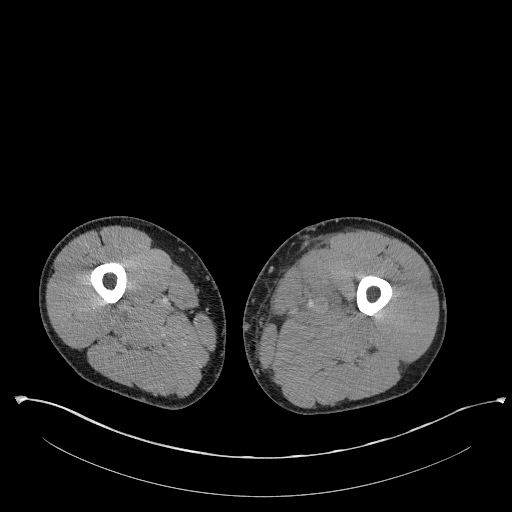
[im 107/206  soft-tissue]
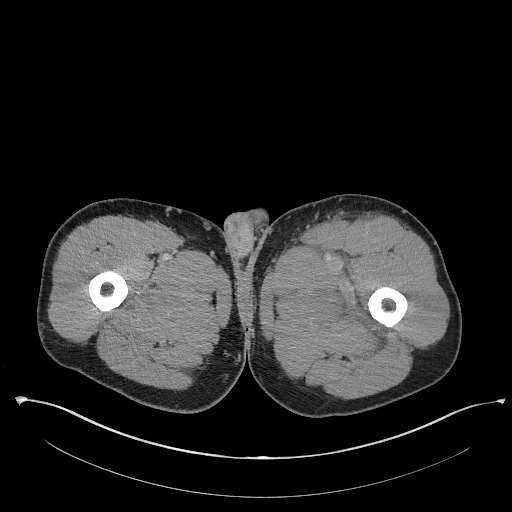
[im 122/206  soft-tissue]
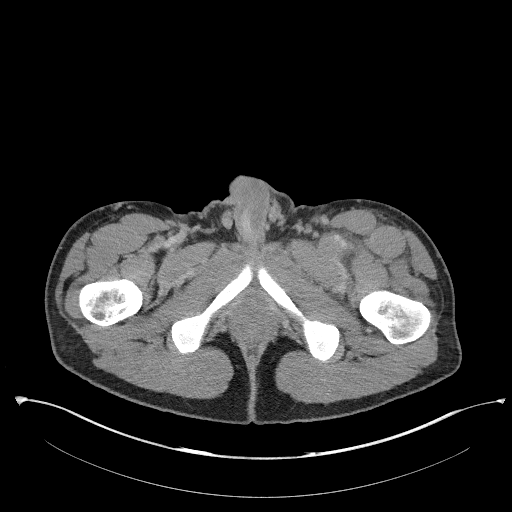
[im 145/206  soft-tissue]
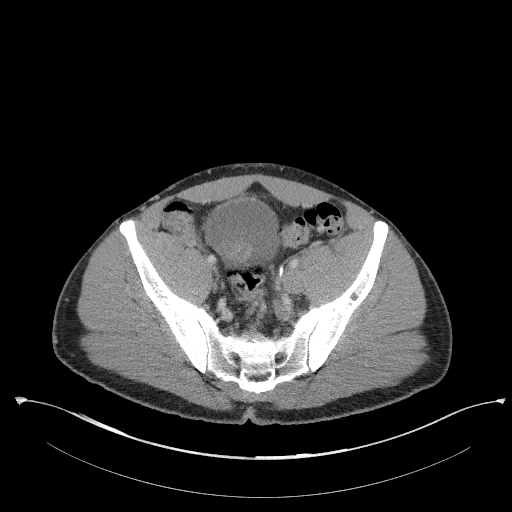
[im 168/206  soft-tissue]
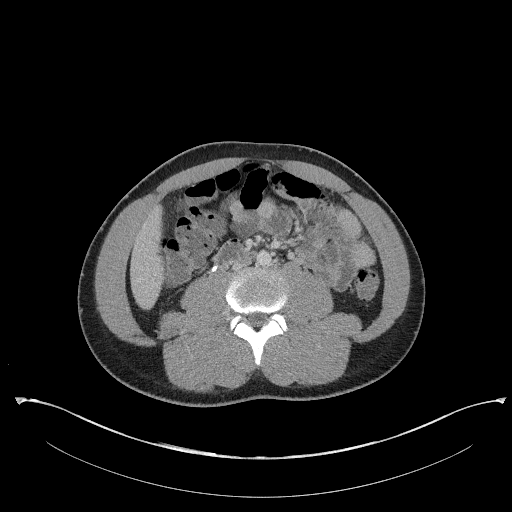
[im 190/206  soft-tissue]
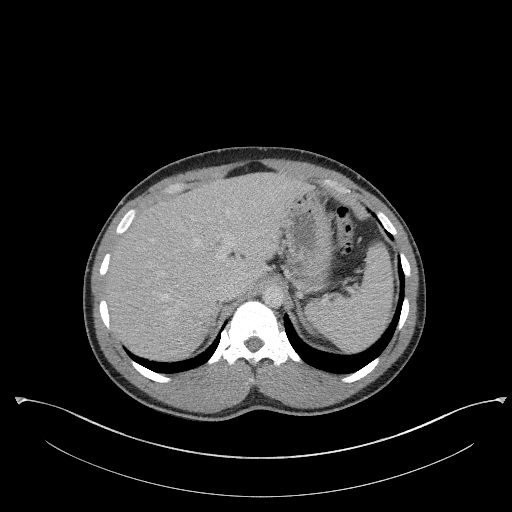
[im 190/206  bone]
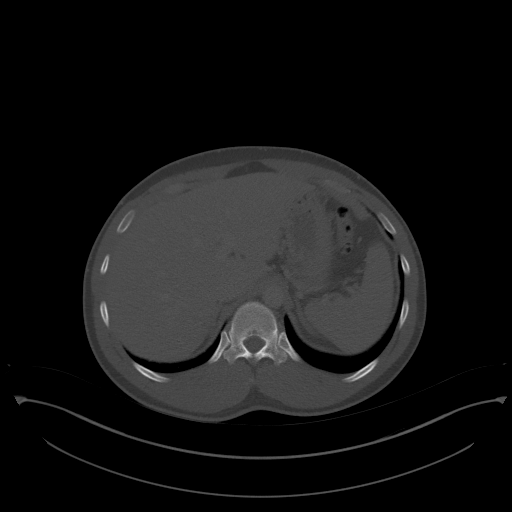

[Series 5: coronals · coronal · 0.91mm/px · 3 of 123 slices shown]
[im 31/123  soft-tissue]
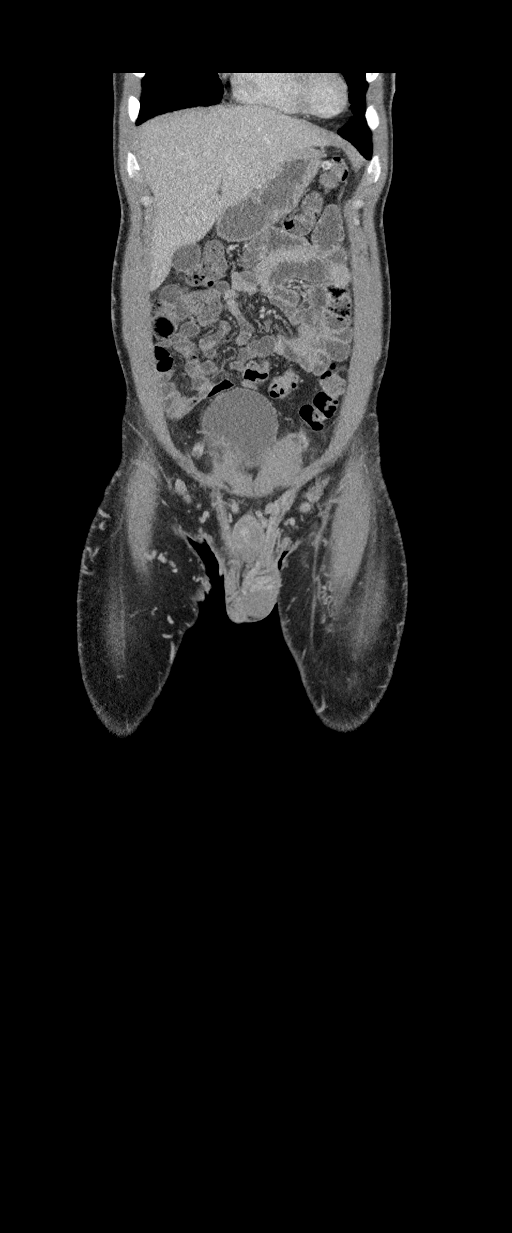
[im 62/123  soft-tissue]
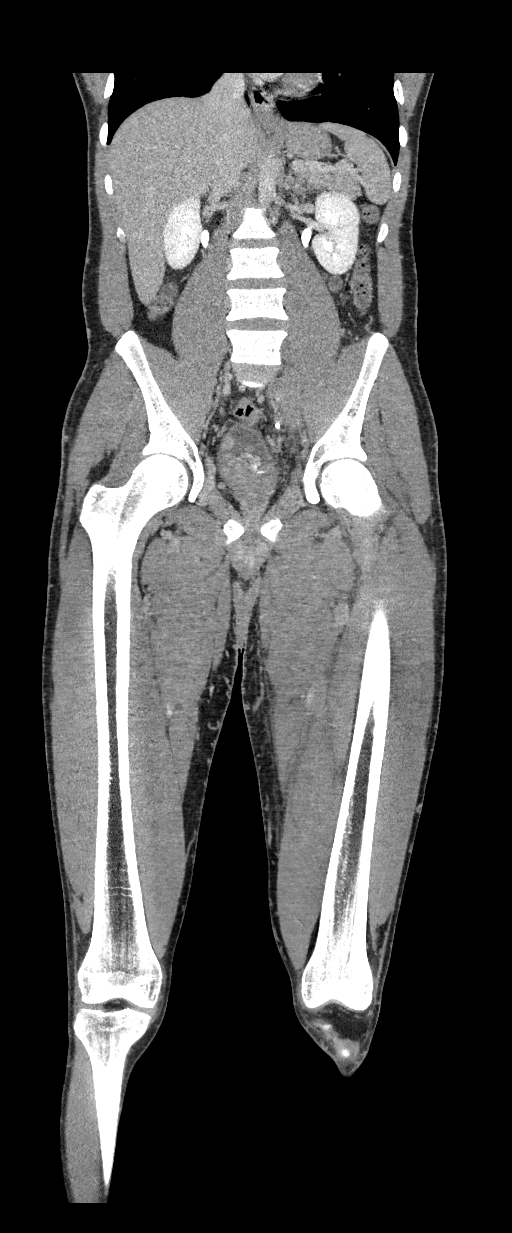
[im 92/123  soft-tissue]
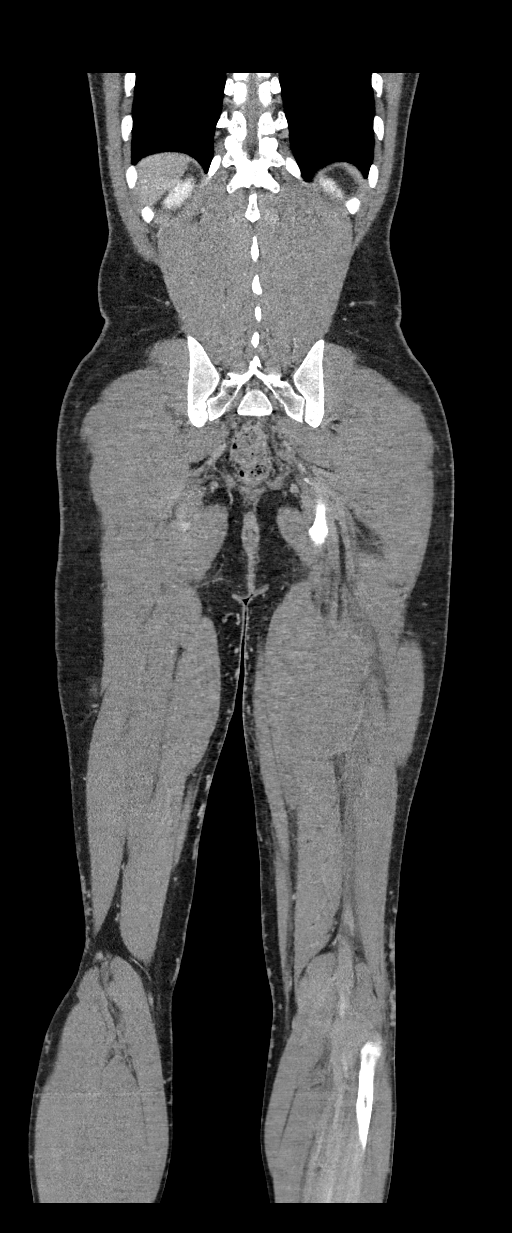

[12 of 46 positions shown; findings below may reference images not displayed]

FINDINGS: CT venogram: There is dilatation and filling defect consistent with
deep venous thrombosis involving the left tibioperoneal trunk, left
popliteal vein, left deep femoral vein, left common femoral vein,
left external iliac vein, and left internal iliac vein. Left deep
femoral vein is also involved. The inferior vena cava and iliac
veins are somewhat flattened appear patent. Right pelvic and right
leg veins are patent. Left leg veins demonstrate infiltration in the
fat around the venous structures possibly due to obstruction or
possibly indicating infection. Thrombophlebitis is not excluded.
Mild diffuse soft tissue edema in the left leg.

CT abdomen and pelvis: Lung bases are clear. The liver, spleen,
gallbladder, pancreas, adrenal glands, kidneys, abdominal aorta, and
retroperitoneal lymph nodes are unremarkable. Stomach, small bowel,
and colon are not abnormally distended. No free air or free fluid in
the abdomen.

Pelvis: Bladder wall is not thickened. No pelvic mass or
lymphadenopathy. No free or loculated pelvic fluid collections.
Prostate gland is not enlarged. Edema in the soft tissues with
superficial S collaterals. Scattered lymph nodes in the pelvis are
not abnormally distended. Appendix is normal. No destructive bone
lesions.
IMPRESSION: Deep venous thrombosis demonstrated involving the left pelvic veins
and deep venous system of the left leg, extending from the left
external iliac, internal iliac, common femoral, superficial and deep
femoral, popliteal, and tibial peroneal veins. Associated edema and
collateral venous structures demonstrated. Infiltration around the
deep veins of the left leg may indicate edema or phlebitis.
